# Patient Record
Sex: Female | Born: 1985 | Race: White | Hispanic: No | Marital: Married | Smoking: Never smoker
Health system: Southern US, Community
[De-identification: ages and names within clinical notes are randomized; demographics above are authoritative.]

## PROBLEM LIST (undated history)

## (undated) ENCOUNTER — Inpatient Hospital Stay (HOSPITAL_COMMUNITY): Payer: Self-pay

## (undated) DIAGNOSIS — Z789 Other specified health status: Secondary | ICD-10-CM

## (undated) HISTORY — PX: WISDOM TOOTH EXTRACTION: SHX21

## (undated) HISTORY — PX: TONSILLECTOMY AND ADENOIDECTOMY: SHX28

## (undated) HISTORY — PX: DILATION AND CURETTAGE OF UTERUS: SHX78

## (undated) HISTORY — PX: LAPAROSCOPY: SHX197

---
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
## 2010-09-13 ENCOUNTER — Encounter: Payer: Self-pay | Admitting: Family Medicine

## 2010-10-07 ENCOUNTER — Ambulatory Visit (HOSPITAL_COMMUNITY)
Admission: RE | Admit: 2010-10-07 | Discharge: 2010-10-07 | Disposition: A | Discharge status: Home / Self Care | Payer: Managed Care, Other (non HMO) | Source: Ambulatory Visit | Attending: Obstetrics and Gynecology | Admitting: Obstetrics and Gynecology

## 2010-10-07 DIAGNOSIS — N949 Unspecified condition associated with female genital organs and menstrual cycle: Secondary | ICD-10-CM | POA: Insufficient documentation

## 2010-10-07 LAB — APTT: aPTT: 30 seconds (ref 24–37)

## 2010-10-07 LAB — URINALYSIS, ROUTINE W REFLEX MICROSCOPIC
Bilirubin Urine: NEGATIVE
Hgb urine dipstick: NEGATIVE
Ketones, ur: NEGATIVE mg/dL
Nitrite: NEGATIVE
Protein, ur: NEGATIVE mg/dL
Urine Glucose, Fasting: NEGATIVE mg/dL
Urobilinogen, UA: 0.2 mg/dL (ref 0.0–1.0)
pH: 6.5 (ref 5.0–8.0)

## 2010-10-07 LAB — SURGICAL PCR SCREEN: Staphylococcus aureus: NEGATIVE

## 2010-10-07 LAB — CBC
HCT: 42.5 % (ref 36.0–46.0)
MCH: 26.4 pg (ref 26.0–34.0)
MCHC: 32 g/dL (ref 30.0–36.0)
MCV: 82.5 fL (ref 78.0–100.0)
Platelets: 210 10*3/uL (ref 150–400)
RBC: 5.15 MIL/uL — ABNORMAL HIGH (ref 3.87–5.11)
RDW: 14.2 % (ref 11.5–15.5)
WBC: 8.2 10*3/uL (ref 4.0–10.5)

## 2010-10-07 LAB — PROTIME-INR
INR: 1.01 (ref 0.00–1.49)
Prothrombin Time: 13.5 seconds (ref 11.6–15.2)

## 2010-10-07 LAB — BASIC METABOLIC PANEL
BUN: 10 mg/dL (ref 6–23)
Creatinine, Ser: 0.94 mg/dL (ref 0.4–1.2)
GFR calc non Af Amer: >60 mL/min (ref >60)

## 2010-10-07 LAB — PREGNANCY, URINE: Preg Test, Ur: NEGATIVE

## 2010-10-20 NOTE — Op Note (Addendum)
[name], Kelley NO.:  [id number]  MEDICAL RECORD NO.:  [id number]           PATIENT TYPE:  O  LOCATION:  [facility]                          F[facility]  [facility]  PHYSICIAN:  Miguel Aschoff, M.D.       DATE OF BIRTH:  Mar 14, 1986  DATE OF PROCEDURE:  10/07/2010 DATE OF DISCHARGE:  10/07/2010                              OPERATIVE REPORT   PREOPERATIVE DIAGNOSIS:  Chronic pelvic pain.  POSTOPERATIVE DIAGNOSIS:  Chronic pelvic pain.  PROCEDURE:  Diagnostic laparoscopy.  SURGEON:  Miguel Aschoff, M.D.  ANESTHESIA:  General.  COMPLICATIONS:  None.  JUSTIFICATION:  The patient is a 25 year old white female gravida 3, para 2-0-1-2 with history of persistent pelvic pain which has been going on for years associated with lots of pain with sex.  Because of her symptomatology, she has requested that a definitive procedure be carried out in an effort to control her pain and see if the etiology can be established.  She presents now to undergo diagnostic laparoscopy. Informed consent has been obtained.  PROCEDURE:  The patient was taken to the operating placed in supine position.  General anesthesia was administered without difficulty.  She was then placed in the dorsal lithotomy position, prepped and draped in the usual sterile fashion.  Once this was done, the bladder was catheterized.  Examination under anesthesia was then carried out.  This revealed normal external genitalia Normal Bartholin, Skene glands, normal urethra.  The vaginal vault was without gross lesion.  The cervix was without gross lesion.  The adnexa revealed no masses.  At this point, speculum was placed in the vaginal vault.  Anterior cervical lip was grasped with a tenaculum and then a Hulka tenaculum was placed through the cervix and held.  Attention was then directed to the umbilicus where a small infraumbilical incision was made.  A Veress needle was then inserted and the abdomen was insufflated with 3  liters of CO2.  Following the insufflation, the trocar to the laparoscope was placed followed by laparoscope itself.  Then under direct visualization, a secondary 5-mm port was established suprapubically.  Systematic inspection of the pelvic organs and abdominal organs revealed the bladder peritoneum to be unremarkable.  The round ligaments were unremarkable.  No hernias were noted.  The uterus was small somewhat mottled in appearance and suggestive of possible adenomyosis but no lesions were noted on the surface of the uterus.  No myomas were noted. The tubes were examined along the course that were normal.  The fimbriae were fine and delicate.  The ovaries appeared to be within normal limits.  The only remarkable finding in the pelvis was some increased varicosities noted in the left adnexa.  The cul-de-sac was inspected. There were no peritoneal window seen.  There was no evidence of endometrial implants.  The uterosacral ligaments appeared to be within normal limits.  The appendix was then visualized, was noted be also within normal limits.  The intestinal surfaces were unremarkable.  Liver was visualized again appeared to be within normal limits.  With no other abnormalities being noted in the pelvis, it was elected to complete the procedure  after photographic documentation of the findings, the CO2 was allowed to escape.  All instruments were removed and small incisions were closed with subcuticular 4-0 Vicryl.  Dermabond was then applied. The estimated blood loss was minimal.  Plan is for the patient to be discharged home.  Medications for home include Tylox one every 3 hours as needed for pain. She has been instructed to place nothing in vagina for 2 weeks.  She is to call within 2 days to discuss her intraoperative findings, and she is to set a followup visit up for 2 weeks.  The patient is call to [place] if there are any problems such as fever, pain, or heavy bleeding.  She  was sent home in satisfactory condition.     Miguel Aschoff, M.D.     AR/MEDQ  D:  10/07/2010  T:  10/08/2010  Job:  [id number]  Electronically Signed by Miguel Aschoff M.D. on 10/20/2010 02:09:05 PM

## 2011-03-05 ENCOUNTER — Other Ambulatory Visit: Payer: Self-pay | Admitting: Obstetrics and Gynecology

## 2011-03-05 DIAGNOSIS — R102 Pelvic and perineal pain: Secondary | ICD-10-CM

## 2011-03-06 ENCOUNTER — Other Ambulatory Visit: Payer: Managed Care, Other (non HMO)

## 2011-03-12 ENCOUNTER — Other Ambulatory Visit: Payer: Managed Care, Other (non HMO)

## 2011-03-12 ENCOUNTER — Ambulatory Visit
Admission: RE | Admit: 2011-03-12 | Discharge: 2011-03-12 | Disposition: A | Discharge status: Home / Self Care | Payer: Managed Care, Other (non HMO) | Source: Ambulatory Visit | Attending: Obstetrics and Gynecology | Admitting: Obstetrics and Gynecology

## 2011-03-12 DIAGNOSIS — R102 Pelvic and perineal pain: Secondary | ICD-10-CM

## 2011-03-12 MED ORDER — IOHEXOL 300 MG/ML  SOLN
125.0000 mL | Freq: Once | INTRAMUSCULAR | Status: AC | PRN
  Administered 2011-03-12: 125 mL via INTRAVENOUS

## 2011-04-13 ENCOUNTER — Other Ambulatory Visit (HOSPITAL_COMMUNITY): Payer: Self-pay | Admitting: Urology

## 2011-04-13 DIAGNOSIS — R102 Pelvic and perineal pain: Secondary | ICD-10-CM

## 2011-04-23 ENCOUNTER — Encounter (HOSPITAL_COMMUNITY)
Admission: RE | Admit: 2011-04-23 | Discharge: 2011-04-23 | Disposition: A | Discharge status: Home / Self Care | Payer: Managed Care, Other (non HMO) | Source: Ambulatory Visit | Attending: Urology | Admitting: Urology

## 2011-04-23 DIAGNOSIS — R102 Pelvic and perineal pain: Secondary | ICD-10-CM

## 2011-04-23 DIAGNOSIS — N949 Unspecified condition associated with female genital organs and menstrual cycle: Secondary | ICD-10-CM | POA: Insufficient documentation

## 2011-04-23 MED ORDER — FUROSEMIDE 10 MG/ML IJ SOLN: 42.0000 mg/h | Freq: Once | INTRAVENOUS | Status: DC

## 2011-04-23 MED ORDER — TECHNETIUM TC 99M MERTIATIDE
15.0000 | Freq: Once | INTRAVENOUS | Status: AC | PRN
  Administered 2011-04-23: 15 via INTRAVENOUS

## 2013-05-21 IMAGING — NM NM RENAL IMAGING FLOW W/ PHARM
2 series · 12 of 12 positions shown · non-contrast
Comparison: CT abdomen 03/12/2011.

CLINICAL DATA: Pelvic pain.  Evaluate obstruction.

NUCLEAR MEDICINE RENAL SCINTIANGIOGRAPHY WITH FLOW AND FUNCTION AND
PHARMACOLOGIC AUGMENTATION
TECHNIQUE: Radionuclide angiographic and sequential renal images
were obtained after intravenous injection of radiopharmaceutical.
Imaging was continued during slow intravenous injection of Lasix
approximately 20-30 minutes after the start of the examination.
Radiopharmaceutical: 15.0 mCi technetium MAG 3

[Series 1: re renal qualitative · 9.51mm/px · 6 of 107 frames shown (1 of 2)]
[frame 9/107]
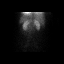
[frame 27/107]
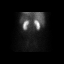
[frame 45/107]
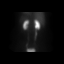
[frame 63/107]
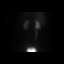
[frame 81/107]
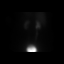
[frame 99/107]
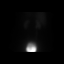

[Series 1: re renal qualitative · 9.51mm/px · 6 of 107 frames shown (2 of 2)]
[frame 9/107]
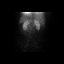
[frame 27/107]
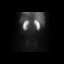
[frame 45/107]
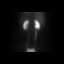
[frame 63/107]
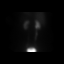
[frame 81/107]
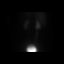
[frame 99/107]
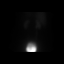

[12 of 12 positions shown; findings below may reference images not displayed]

FINDINGS: The initial flow study demonstrates the appearance of
both kidneys at the same time the aorta is visualized.  There is
symmetric uptake of the radiopharmaceutical in both kidneys.  Very
slight delayed washout of the right kidney compared to the left.
The right ureter is also slightly prominent as demonstrated on the
prior CT scan.  The overall renogram curve is relatively normal.

Relative functioning renal mass is 47.8% on the left and 52.2% on
the right.
IMPRESSION: 1.  Symmetric flow to both kidneys.
2.  Very slight delayed washout of the right kidney compared to the
left.
3.  Relative functioning renal mass is 47.8% on the left and 52.2%
on the right.

## 2013-08-24 ENCOUNTER — Inpatient Hospital Stay (HOSPITAL_COMMUNITY)
Admission: AD | Admit: 2013-08-24 | Discharge: 2013-08-24 | Disposition: A | Discharge status: Home / Self Care | Payer: BC Managed Care – PPO | Source: Ambulatory Visit | Attending: Obstetrics and Gynecology | Admitting: Obstetrics and Gynecology

## 2013-08-24 ENCOUNTER — Inpatient Hospital Stay (HOSPITAL_COMMUNITY): Payer: BC Managed Care – PPO

## 2013-08-24 ENCOUNTER — Encounter (HOSPITAL_COMMUNITY): Payer: Self-pay

## 2013-08-24 DIAGNOSIS — O26859 Spotting complicating pregnancy, unspecified trimester: Secondary | ICD-10-CM | POA: Insufficient documentation

## 2013-08-24 DIAGNOSIS — O26851 Spotting complicating pregnancy, first trimester: Secondary | ICD-10-CM

## 2013-08-24 HISTORY — DX: Other specified health status: Z78.9

## 2013-08-24 LAB — URINALYSIS, ROUTINE W REFLEX MICROSCOPIC
Bilirubin Urine: NEGATIVE
Glucose, UA: NEGATIVE mg/dL
KETONES UR: NEGATIVE mg/dL
LEUKOCYTES UA: NEGATIVE
NITRITE: NEGATIVE
PH: 6.5 (ref 5.0–8.0)
Protein, ur: NEGATIVE mg/dL
Specific Gravity, Urine: 1.01 (ref 1.005–1.030)
Urobilinogen, UA: 0.2 mg/dL (ref 0.0–1.0)

## 2013-08-24 LAB — CBC
HCT: 37.3 % (ref 36.0–46.0)
HEMOGLOBIN: 12.7 g/dL (ref 12.0–15.0)
MCH: 27.2 pg (ref 26.0–34.0)
MCHC: 34 g/dL (ref 30.0–36.0)
MCV: 79.9 fL (ref 78.0–100.0)
PLATELETS: 196 10*3/uL (ref 150–400)
RBC: 4.67 MIL/uL (ref 3.87–5.11)
RDW: 13.3 % (ref 11.5–15.5)
WBC: 10.6 10*3/uL — AB (ref 4.0–10.5)

## 2013-08-24 LAB — HCG, QUANTITATIVE, PREGNANCY: HCG, BETA CHAIN, QUANT, S: 27514 m[IU]/mL — AB (ref <5)

## 2013-08-24 LAB — POCT PREGNANCY, URINE: PREG TEST UR: POSITIVE — AB

## 2013-08-24 LAB — URINE MICROSCOPIC-ADD ON

## 2013-08-24 MED ORDER — RHO D IMMUNE GLOBULIN 1500 UNIT/2ML IJ SOLN
300.0000 ug | Freq: Once | INTRAMUSCULAR | Status: AC
  Administered 2013-08-24: 300 ug via INTRAMUSCULAR
  Filled 2013-08-24: qty 2

## 2013-08-24 NOTE — MAU Provider Note (Signed)
History     CSN: [id number]  Arrival date and time: 08/24/13 1908   None     Chief Complaint  Patient presents with  . Vaginal Bleeding   HPI  Diana Kelley is a 28 y.o. [id number] at [id number] who presents today with spotting. She reports that she is O negative blood type. She started spotting around 1800 today she denies any pain or recent intercourse. She states that the bleeding is like a light period. She is not bleeding any longer.   Past Medical History  Diagnosis Date  . Medical history non-contributory     Past Surgical History  Procedure Laterality Date  . Dilation and curettage of uterus    . Laparoscopy      History reviewed. No pertinent family history.  History  Substance Use Topics  . Smoking status: Never Smoker   . Smokeless tobacco: Not on file  . Alcohol Use: No    Allergies:  Allergies  Allergen Reactions  . Naproxen Nausea And Vomiting    Prescriptions prior to admission  Medication Sig Dispense Refill  . Prenatal Vit-Fe Fumarate-FA (PRENATAL MULTIVITAMIN) TABS tablet Take 1 tablet by mouth daily at 12 noon.      . ondansetron (ZOFRAN) 4 MG tablet Take 4 tablets by mouth.      . progesterone (PROMETRIUM) 200 MG capsule Take 1 capsule by mouth daily.      . promethazine (PHENERGAN) 25 MG tablet Take 25 mg by mouth.      . TAMIFLU 75 MG capsule Take 10 mg by mouth daily.        ROS Physical Exam   Blood pressure 119/76, pulse 79, temperature 97.6 F (36.4 C), temperature source Oral, resp. rate 16, height 5\' 3"  (1.6 m), weight 95.255 kg (210 lb), last menstrual period 06/22/2013.  Physical Exam  Nursing note and vitals reviewed. Constitutional: She is oriented to person, place, and time. She appears well-developed and well-nourished. No distress.  Cardiovascular: Normal rate.   Respiratory: Effort normal.  Neurological: She is alert and oriented to person, place, and time.  Skin: Skin is warm and dry.  Psychiatric: She has a normal mood  and affect.    MAU Course  Procedures  Results for orders placed during the hospital encounter of 08/24/13 (from the past 24 hour(s))  URINALYSIS, ROUTINE W REFLEX MICROSCOPIC     Status: Abnormal   Collection Time    08/24/13  7:55 PM      Result Value Range   Color, Urine STRAW (*) YELLOW   APPearance CLEAR  CLEAR   Specific Gravity, Urine 1.010  1.005 - 1.030   pH 6.5  5.0 - 8.0   Glucose, UA NEGATIVE  NEGATIVE mg/dL   Hgb urine dipstick SMALL (*) NEGATIVE   Bilirubin Urine NEGATIVE  NEGATIVE   Ketones, ur NEGATIVE  NEGATIVE mg/dL   Protein, ur NEGATIVE  NEGATIVE mg/dL   Urobilinogen, UA 0.2  0.0 - 1.0 mg/dL   Nitrite NEGATIVE  NEGATIVE   Leukocytes, UA NEGATIVE  NEGATIVE  URINE MICROSCOPIC-ADD ON     Status: None   Collection Time    08/24/13  7:55 PM      Result Value Range   Squamous Epithelial / LPF RARE  RARE   WBC, UA 0-2  <3 WBC/hpf   RBC / HPF 0-2  <3 RBC/hpf   Bacteria, UA RARE  RARE  POCT PREGNANCY, URINE     Status: Abnormal   Collection Time  08/24/13  8:03 PM      Result Value Range   Preg Test, Ur POSITIVE (*) NEGATIVE  CBC     Status: Abnormal   Collection Time    08/24/13  8:10 PM      Result Value Range   WBC 10.6 (*) 4.0 - 10.5 K/uL   RBC 4.67  3.87 - 5.11 MIL/uL   Hemoglobin 12.7  12.0 - 15.0 g/dL   HCT 16.1  09.6 - 04.5 %   MCV 79.9  78.0 - 100.0 fL   MCH 27.2  26.0 - 34.0 pg   MCHC 34.0  30.0 - 36.0 g/dL   RDW 40.9  81.1 - 91.4 %   Platelets 196  150 - 400 K/uL  HCG, QUANTITATIVE, PREGNANCY     Status: Abnormal   Collection Time    08/24/13  8:10 PM      Result Value Range   hCG, Beta Chain, Quant, S [id number] (*) <5 mIU/mL   US Ob Comp Less 14 Wks  08/24/2013   CLINICAL DATA:  Vaginal bleeding. Current assigned gestational age of nine weeks 0 days based on office ultrasound.  EXAM: OBSTETRIC <14 WK ULTRASOUND  TECHNIQUE: Transabdominal ultrasound was performed for evaluation of the gestation as well as the maternal uterus and adnexal  regions.  COMPARISON:  None.  FINDINGS: Intrauterine gestational sac: Visualized/normal in shape.  Yolk sac:  Visualized  Embryo:  Visualized  Cardiac Activity: Visualized  Heart Rate: 176 bpm  CRL:   24  mm   9 w 1 d                  [place] EDC: 03/28/2014  Maternal uterus/adnexae: No mass or free fluid identified. Both ovaries are normal in appearance.  IMPRESSION: Single living IUP measuring 9 weeks 1 day, which is concordant with assigned gestational age.  No significant maternal uterine or adnexal abnormality identified.   Electronically Signed   By: Myles Rosenthal M.D.   On: 08/24/2013 21:00     Assessment and Plan   1. Spotting in pregnancy, first trimester    Rhogam given here in MAU Bleeding precautions Return to MAU as needed   Follow-up Information   Follow up with Levi Aland, MD. (as scheduled )    Specialty:  Obstetrics and Gynecology   Contact information:   [address] [place] [address] [phone]        Tawnya Crook 08/24/2013, 9:05 PM

## 2013-08-24 NOTE — Discharge Instructions (Signed)
Rh0 [D] Immune Globulin injection What is this medicine? RhO [D] IMMUNE GLOBULIN (i MYOON GLOB yoo lin) is used to treat idiopathic thrombocytopenic purpura (ITP). This medicine is used in RhO negative mothers who are pregnant with a RhO positive child. It is also used after a transfusion of RhO positive blood into a RhO negative person. This medicine may be used for other purposes; ask your health care provider or pharmacist if you have questions. COMMON BRAND NAME(S): BayRho-D, HyperRHO S/D, MICRhoGAM, RhoGAM, Rhophylac, WinRho SDF What should I tell my health care provider before I take this medicine? They need to know if you have any of these conditions: -bleeding disorders -low levels of immunoglobulin A in the body -no spleen -an unusual or allergic reaction to human immune globulin, other medicines, foods, dyes, or preservatives -pregnant or trying to get pregnant -breast-feeding How should I use this medicine? This medicine is for injection into a muscle or into a vein. It is given by a health care professional in a hospital or clinic setting. Talk to your pediatrician regarding the use of this medicine in children. This medicine is not approved for use in children. Overdosage: If you think you have taken too much of this medicine contact a poison control center or emergency room at once. NOTE: This medicine is only for you. Do not share this medicine with others. What if I miss a dose? It is important not to miss your dose. Call your doctor or health care professional if you are unable to keep an appointment. What may interact with this medicine? -live virus vaccines, like measles, mumps, or rubella This list may not describe all possible interactions. Give your health care provider a list of all the medicines, herbs, non-prescription drugs, or dietary supplements you use. Also tell them if you smoke, drink alcohol, or use illegal drugs. Some items may interact with your  medicine. What should I watch for while using this medicine? This medicine is made from human blood. It may be possible to pass an infection in this medicine. Talk to your doctor about the risks and benefits of this medicine. This medicine may interfere with live virus vaccines. Before you get live virus vaccines tell your health care professional if you have received this medicine within the past 3 months. What side effects may I notice from receiving this medicine? Side effects that you should report to your doctor or health care professional as soon as possible: -allergic reactions like skin rash, itching or hives, swelling of the face, lips, or tongue -breathing problems -chest pain or tightness -yellowing of the eyes or skin Side effects that usually do not require medical attention (report to your doctor or health care professional if they continue or are bothersome): -fever -pain and tenderness at site where injected This list may not describe all possible side effects. Call your doctor for medical advice about side effects. You may report side effects to FDA at 1-800-FDA-1088. Where should I keep my medicine? This drug is given in a hospital or clinic and will not be stored at home. NOTE: This sheet is a summary. It may not cover all possible information. If you have questions about this medicine, talk to your doctor, pharmacist, or health care provider.  2014, Elsevier/Gold Standard. (2008-04-09 14:06:10)  Pregnancy - First Trimester During sexual intercourse, millions of sperm go into the vagina. Only 1 sperm will penetrate and fertilize the female egg while it is in the Fallopian tube. One week later, the fertilized  egg implants into the wall of the uterus. An embryo begins to develop into a baby. At 6 to 8 weeks, the eyes and face are formed and the heartbeat can be seen on ultrasound. At the end of 12 weeks (first trimester), all the baby's organs are formed. Now that you are  pregnant, you will want to do everything you can to have a healthy baby. Two of the most important things are to get good prenatal care and follow your caregiver's instructions. Prenatal care is all the medical care you receive before the baby's birth. It is given to prevent, find, and treat problems during the pregnancy and childbirth. PRENATAL EXAMS  During prenatal visits, your weight, blood pressure, and urine are checked. This is done to make sure you are healthy and progressing normally during the pregnancy.  A pregnant woman should gain 25 to 35 pounds during the pregnancy. However, if you are overweight or underweight, your caregiver will advise you regarding your weight.  Your caregiver will ask and answer questions for you.  Blood work, cervical cultures, other necessary tests, and a Pap test are done during your prenatal exams. These tests are done to check on your health and the probable health of your baby. Tests are strongly recommended and done for HIV with your permission. This is the virus that causes AIDS. These tests are done because medicines can be given to help prevent your baby from being born with this infection should you have been infected without knowing it. Blood work is also used to find out your blood type, previous infections, and follow your blood levels (hemoglobin).  Low hemoglobin (anemia) is common during pregnancy. Iron and vitamins are given to help prevent this. Later in the pregnancy, blood tests for diabetes will be done along with any other tests if any problems develop.  You may need other tests to make sure you and the baby are doing well. CHANGES DURING THE FIRST TRIMESTER  Your body goes through many changes during pregnancy. They vary from person to person. Talk to your caregiver about changes you notice and are concerned about. Changes can include:  Your menstrual period stops.  The egg and sperm carry the genes that determine what you look like.  Genes from you and your partner are forming a baby. The female genes determine whether the baby is a boy or a girl.  Your body increases in girth and you may feel bloated.  Feeling sick to your stomach (nauseous) and throwing up (vomiting). If the vomiting is uncontrollable, call your caregiver.  Your breasts will begin to enlarge and become tender.  Your nipples may stick out more and become darker.  The need to urinate more. Painful urination may mean you have a bladder infection.  Tiring easily.  Loss of appetite.  Cravings for certain kinds of food.  At first, you may gain or lose a couple of pounds.  You may have changes in your emotions from day to day (excited to be pregnant or concerned something may go wrong with the pregnancy and baby).  You may have more vivid and strange dreams. HOME CARE INSTRUCTIONS   It is very important to avoid all smoking, alcohol and non-prescribed drugs during your pregnancy. These affect the formation and growth of the baby. Avoid chemicals while pregnant to ensure the delivery of a healthy infant.  Start your prenatal visits by the 12th week of pregnancy. They are usually scheduled monthly at first, then more often in the  last 2 months before delivery. Keep your caregiver's appointments. Follow your caregiver's instructions regarding medicine use, blood and lab tests, exercise, and diet.  During pregnancy, you are providing food for you and your baby. Eat regular, well-balanced meals. Choose foods such as meat, fish, milk and other low fat dairy products, vegetables, fruits, and whole-grain breads and cereals. Your caregiver will tell you of the ideal weight gain.  You can help morning sickness by keeping soda crackers at the bedside. Eat a couple before arising in the morning. You may want to use the crackers without salt on them.  Eating 4 to 5 small meals rather than 3 large meals a day also may help the nausea and vomiting.  Drinking liquids  between meals instead of during meals also seems to help nausea and vomiting.  A physical sexual relationship may be continued throughout pregnancy if there are no other problems. Problems may be early (premature) leaking of amniotic fluid from the membranes, vaginal bleeding, or belly (abdominal) pain.  Exercise regularly if there are no restrictions. Check with your caregiver or physical therapist if you are unsure of the safety of some of your exercises. Greater weight gain will occur in the last 2 trimesters of pregnancy. Exercising will help:  Control your weight.  Keep you in shape.  Prepare you for labor and delivery.  Help you lose your pregnancy weight after you deliver your baby.  Wear a good support or jogging bra for breast tenderness during pregnancy. This may help if worn during sleep too.  Ask when prenatal classes are available. Begin classes when they are offered.  Do not use hot tubs, steam rooms, or saunas.  Wear your seat belt when driving. This protects you and your baby if you are in an accident.  Avoid raw meat, uncooked cheese, cat litter boxes, and soil used by cats throughout the pregnancy. These carry germs that can cause birth defects in the baby.  The first trimester is a good time to visit your dentist for your dental health. Getting your teeth cleaned is okay. Use a softer toothbrush and brush gently during pregnancy.  Ask for help if you have financial, counseling, or nutritional needs during pregnancy. Your caregiver will be able to offer counseling for these needs as well as refer you for other special needs.  Do not take any medicines or herbs unless told by your caregiver.  Inform your caregiver if there is any mental or physical domestic violence.  Make a list of emergency phone numbers of family, friends, hospital, and police and fire departments.  Write down your questions. Take them to your prenatal visit.  Do not douche.  Do not cross  your legs.  If you have to stand for long periods of time, rotate you feet or take small steps in a circle.  You may have more vaginal secretions that may require a sanitary pad. Do not use tampons or scented sanitary pads. MEDICINES AND DRUG USE IN PREGNANCY  Take prenatal vitamins as directed. The vitamin should contain 1 milligram of folic acid. Keep all vitamins out of reach of children. Only a couple vitamins or tablets containing iron may be fatal to a baby or young child when ingested.  Avoid use of all medicines, including herbs, over-the-counter medicines, not prescribed or suggested by your caregiver. Only take over-the-counter or prescription medicines for pain, discomfort, or fever as directed by your caregiver. Do not use aspirin, ibuprofen, or naproxen unless directed by your caregiver.  Let your caregiver also know about herbs you may be using.  Alcohol is related to a number of birth defects. This includes fetal alcohol syndrome. All alcohol, in any form, should be avoided completely. Smoking will cause low birth rate and premature babies.  Street or illegal drugs are very harmful to the baby. They are absolutely forbidden. A baby born to an addicted mother will be addicted at birth. The baby will go through the same withdrawal an adult does.  Let your caregiver know about any medicines that you have to take and for what reason you take them. SEEK MEDICAL CARE IF:  You have any concerns or worries during your pregnancy. It is better to call with your questions if you feel they cannot wait, rather than worry about them. SEEK IMMEDIATE MEDICAL CARE IF:   An unexplained oral temperature above 102 F (38.9 C) develops, or as your caregiver suggests.  You have leaking of fluid from the vagina (birth canal). If leaking membranes are suspected, take your temperature and inform your caregiver of this when you call.  There is vaginal spotting or bleeding. Notify your caregiver of  the amount and how many pads are used.  You develop a bad smelling vaginal discharge with a change in the color.  You continue to feel sick to your stomach (nauseated) and have no relief from remedies suggested. You vomit blood or coffee ground-like materials.  You lose more than 2 pounds of weight in 1 week.  You gain more than 2 pounds of weight in 1 week and you notice swelling of your face, hands, feet, or legs.  You gain 5 pounds or more in 1 week (even if you do not have swelling of your hands, face, legs, or feet).  You get exposed to [place] measles and have never had them.  You are exposed to fifth disease or chickenpox.  You develop belly (abdominal) pain. Round ligament discomfort is a common non-cancerous (benign) cause of abdominal pain in pregnancy. Your caregiver still must evaluate this.  You develop headache, fever, diarrhea, pain with urination, or shortness of breath.  You fall or are in a car accident or have any kind of trauma.  There is mental or physical violence in your home. Document Released: 08/04/2001 Document Revised: 05/04/2012 Document Reviewed: 02/05/2009 [facility] Patient Information 2014 [place], [place].

## 2013-08-24 NOTE — MAU Note (Signed)
Pt G4 P2 at 9wks with spotting today.  Blood type O neg.

## 2013-08-24 NOTE — L&D Delivery Note (Signed)
Pt was admitted in labor.She had SROM with clear fluid. She had occ variable decels in the first stage. She rapidly completed the first stage. She pushed briefly and had a SVD of one live viable white female infant in the ROA postion over an intact perineum. Shoulder cord x 1. Placenta S/I. EBL-400cc/ Baby to NBN.

## 2013-08-25 LAB — RH IG WORKUP (INCLUDES ABO/RH)
ABO/RH(D): O NEG
Antibody Screen: NEGATIVE
Gestational Age(Wks): 9
UNIT DIVISION: 0

## 2013-08-25 LAB — ABO/RH: ABO/RH(D): O NEG

## 2013-09-13 LAB — OB RESULTS CONSOLE RUBELLA ANTIBODY, IGM: Rubella: IMMUNE

## 2013-09-13 LAB — OB RESULTS CONSOLE RPR: RPR: NONREACTIVE

## 2013-09-13 LAB — OB RESULTS CONSOLE HEPATITIS B SURFACE ANTIGEN: Hepatitis B Surface Ag: NEGATIVE

## 2013-09-13 LAB — OB RESULTS CONSOLE HIV ANTIBODY (ROUTINE TESTING): HIV: NONREACTIVE

## 2014-03-02 LAB — OB RESULTS CONSOLE GBS: STREP GROUP B AG: NEGATIVE

## 2014-03-19 ENCOUNTER — Encounter (HOSPITAL_COMMUNITY): Payer: BC Managed Care – PPO | Admitting: Anesthesiology

## 2014-03-19 ENCOUNTER — Inpatient Hospital Stay (HOSPITAL_COMMUNITY)
Admission: AD | Admit: 2014-03-19 | Discharge: 2014-03-21 | DRG: 775 | Disposition: A | Discharge status: Home / Self Care | Payer: BC Managed Care – PPO | Source: Ambulatory Visit | Attending: Obstetrics and Gynecology | Admitting: Obstetrics and Gynecology

## 2014-03-19 ENCOUNTER — Inpatient Hospital Stay (HOSPITAL_COMMUNITY): Payer: BC Managed Care – PPO | Admitting: Anesthesiology

## 2014-03-19 ENCOUNTER — Encounter (HOSPITAL_COMMUNITY): Payer: Self-pay

## 2014-03-19 DIAGNOSIS — O479 False labor, unspecified: Secondary | ICD-10-CM | POA: Diagnosis present

## 2014-03-19 DIAGNOSIS — Z3483 Encounter for supervision of other normal pregnancy, third trimester: Secondary | ICD-10-CM

## 2014-03-19 DIAGNOSIS — Z3480 Encounter for supervision of other normal pregnancy, unspecified trimester: Secondary | ICD-10-CM

## 2014-03-19 LAB — CBC
HEMATOCRIT: 35.6 % — AB (ref 36.0–46.0)
HEMOGLOBIN: 12.2 g/dL (ref 12.0–15.0)
MCH: 28.6 pg (ref 26.0–34.0)
MCHC: 34.3 g/dL (ref 30.0–36.0)
MCV: 83.6 fL (ref 78.0–100.0)
Platelets: 197 10*3/uL (ref 150–400)
RBC: 4.26 MIL/uL (ref 3.87–5.11)
RDW: 14.4 % (ref 11.5–15.5)
WBC: 20.4 10*3/uL — ABNORMAL HIGH (ref 4.0–10.5)

## 2014-03-19 MED ORDER — CITRIC ACID-SODIUM CITRATE 334-500 MG/5ML PO SOLN: 30.0000 mL | ORAL | Status: DC | PRN

## 2014-03-19 MED ORDER — EPHEDRINE 5 MG/ML INJ
10.0000 mg | INTRAVENOUS | Status: DC | PRN
  Filled 2014-03-19: qty 2

## 2014-03-19 MED ORDER — IBUPROFEN 600 MG PO TABS: 600.0000 mg | ORAL_TABLET | Freq: Four times a day (QID) | ORAL | Status: DC | PRN

## 2014-03-19 MED ORDER — LACTATED RINGERS IV SOLN: 500.0000 mL | Freq: Once | INTRAVENOUS | Status: DC

## 2014-03-19 MED ORDER — ACETAMINOPHEN 325 MG PO TABS: 650.0000 mg | ORAL_TABLET | ORAL | Status: DC | PRN

## 2014-03-19 MED ORDER — PHENYLEPHRINE 40 MCG/ML (10ML) SYRINGE FOR IV PUSH (FOR BLOOD PRESSURE SUPPORT)
80.0000 ug | PREFILLED_SYRINGE | INTRAVENOUS | Status: DC | PRN
  Filled 2014-03-19: qty 2

## 2014-03-19 MED ORDER — FENTANYL 2.5 MCG/ML BUPIVACAINE 1/10 % EPIDURAL INFUSION (WH - ANES)
INTRAMUSCULAR | Status: DC | PRN
  Administered 2014-03-19: 14 mL/h via EPIDURAL

## 2014-03-19 MED ORDER — OXYCODONE-ACETAMINOPHEN 5-325 MG PO TABS: 1.0000 | ORAL_TABLET | ORAL | Status: DC | PRN

## 2014-03-19 MED ORDER — LACTATED RINGERS IV SOLN: 500.0000 mL | INTRAVENOUS | Status: DC | PRN

## 2014-03-19 MED ORDER — FLEET ENEMA 7-19 GM/118ML RE ENEM: 1.0000 | ENEMA | RECTAL | Status: DC | PRN

## 2014-03-19 MED ORDER — LIDOCAINE HCL (PF) 1 % IJ SOLN
30.0000 mL | INTRAMUSCULAR | Status: DC | PRN
  Filled 2014-03-19: qty 30

## 2014-03-19 MED ORDER — ONDANSETRON HCL 4 MG/2ML IJ SOLN: 4.0000 mg | Freq: Four times a day (QID) | INTRAMUSCULAR | Status: DC | PRN

## 2014-03-19 MED ORDER — FENTANYL 2.5 MCG/ML BUPIVACAINE 1/10 % EPIDURAL INFUSION (WH - ANES)
14.0000 mL/h | INTRAMUSCULAR | Status: DC | PRN
  Administered 2014-03-19: 14 mL/h via EPIDURAL
  Filled 2014-03-19: qty 125

## 2014-03-19 MED ORDER — DIPHENHYDRAMINE HCL 50 MG/ML IJ SOLN: 12.5000 mg | INTRAMUSCULAR | Status: DC | PRN

## 2014-03-19 MED ORDER — PHENYLEPHRINE 40 MCG/ML (10ML) SYRINGE FOR IV PUSH (FOR BLOOD PRESSURE SUPPORT)
80.0000 ug | PREFILLED_SYRINGE | INTRAVENOUS | Status: DC | PRN
Start: 2014-03-19 — End: 2014-03-20
  Filled 2014-03-19: qty 10
  Filled 2014-03-19: qty 2

## 2014-03-19 MED ORDER — OXYTOCIN 40 UNITS IN LACTATED RINGERS INFUSION - SIMPLE MED
62.5000 mL/h | INTRAVENOUS | Status: DC
  Filled 2014-03-19: qty 1000

## 2014-03-19 MED ORDER — OXYTOCIN BOLUS FROM INFUSION: 500.0000 mL | INTRAVENOUS | Status: DC

## 2014-03-19 MED ORDER — LIDOCAINE HCL (PF) 1 % IJ SOLN
INTRAMUSCULAR | Status: DC | PRN
Start: 2014-03-19 — End: 2014-03-20
  Administered 2014-03-19 (×2): 4 mL

## 2014-03-19 MED ORDER — EPHEDRINE 5 MG/ML INJ
10.0000 mg | INTRAVENOUS | Status: DC | PRN
Start: 2014-03-19 — End: 2014-03-20
  Filled 2014-03-19: qty 2

## 2014-03-19 MED ORDER — LACTATED RINGERS IV SOLN
INTRAVENOUS | Status: DC
  Administered 2014-03-19 – 2014-03-20 (×2): via INTRAVENOUS

## 2014-03-19 NOTE — Anesthesia Procedure Notes (Signed)
Epidural Patient location during procedure: OB Start time: 03/19/2014 11:14 PM  Staffing Anesthesiologist: Ezechiel Stooksbury A. Performed by: anesthesiologist   Preanesthetic Checklist Completed: patient identified, site marked, surgical consent, pre-op evaluation, timeout performed, IV checked, risks and benefits discussed and monitors and equipment checked  Epidural Patient position: sitting Prep: site prepped and draped and DuraPrep Patient monitoring: continuous pulse ox and blood pressure Approach: midline Location: L3-L4 Injection technique: LOR air  Needle:  Needle type: Tuohy  Needle gauge: 17 G Needle length: 9 cm and 9 Needle insertion depth: 5 cm cm Catheter type: closed end flexible Catheter size: 19 Gauge Catheter at skin depth: 10 cm Test dose: negative and Other  Assessment Events: blood not aspirated, injection not painful, no injection resistance, negative IV test and no paresthesia  Additional Notes Patient identified. Risks and benefits discussed including failed block, incomplete  Pain control, post dural puncture headache, nerve damage, paralysis, blood pressure Changes, nausea, vomiting, reactions to medications-both toxic and allergic and post Partum back pain. All questions were answered. Patient expressed understanding and wished to proceed. Sterile technique was used throughout procedure. Epidural site was Dressed with sterile barrier dressing. No paresthesias, signs of intravascular injection Or signs of intrathecal spread were encountered.  Patient was more comfortable after the epidural was dosed. Please see RN's note for documentation of vital signs and FHR which are stable.

## 2014-03-19 NOTE — MAU Note (Signed)
Was 3 cm in office today.  Contractions every 7 min then got quicker.  Denies leaking.  Denies bleeding.  Baby moving well.

## 2014-03-19 NOTE — Anesthesia Preprocedure Evaluation (Signed)
Anesthesia Evaluation  Patient identified by MRN, date of birth, ID band Patient awake    Reviewed: Allergy & Precautions, H&P , Patient's Chart, lab work & pertinent test results  Airway Mallampati: III TM Distance: >3 FB Neck ROM: Full    Dental no notable dental hx. (+) Teeth Intact   Pulmonary neg pulmonary ROS,  breath sounds clear to auscultation  Pulmonary exam normal       Cardiovascular negative cardio ROS  Rhythm:Regular Rate:Normal     Neuro/Psych negative neurological ROS  negative psych ROS   GI/Hepatic negative GI ROS, Neg liver ROS,   Endo/Other  Obesity  Renal/GU negative Renal ROS  negative genitourinary   Musculoskeletal negative musculoskeletal ROS (+)   Abdominal (+) + obese,   Peds  Hematology negative hematology ROS (+)   Anesthesia Other Findings   Reproductive/Obstetrics (+) Pregnancy                           Anesthesia Physical Anesthesia Plan  ASA: II  Anesthesia Plan: Epidural   Post-op Pain Management:    Induction:   Airway Management Planned: Natural Airway  Additional Equipment:   Intra-op Plan:   Post-operative Plan:   Informed Consent: I have reviewed the patients History and Physical, chart, labs and discussed the procedure including the risks, benefits and alternatives for the proposed anesthesia with the patient or authorized representative who has indicated his/her understanding and acceptance.     Plan Discussed with: Anesthesiologist  Anesthesia Plan Comments:         Anesthesia Quick Evaluation  

## 2014-03-20 ENCOUNTER — Encounter (HOSPITAL_COMMUNITY): Payer: Self-pay | Admitting: *Deleted

## 2014-03-20 DIAGNOSIS — Z3480 Encounter for supervision of other normal pregnancy, unspecified trimester: Secondary | ICD-10-CM

## 2014-03-20 DIAGNOSIS — O479 False labor, unspecified: Secondary | ICD-10-CM | POA: Diagnosis not present

## 2014-03-20 LAB — RPR

## 2014-03-20 MED ORDER — DIBUCAINE 1 % RE OINT: 1.0000 "application " | TOPICAL_OINTMENT | RECTAL | Status: DC | PRN

## 2014-03-20 MED ORDER — SIMETHICONE 80 MG PO CHEW: 80.0000 mg | CHEWABLE_TABLET | ORAL | Status: DC | PRN

## 2014-03-20 MED ORDER — TETANUS-DIPHTH-ACELL PERTUSSIS 5-2.5-18.5 LF-MCG/0.5 IM SUSP: 0.5000 mL | Freq: Once | INTRAMUSCULAR | Status: DC

## 2014-03-20 MED ORDER — ZOLPIDEM TARTRATE 5 MG PO TABS: 5.0000 mg | ORAL_TABLET | Freq: Every evening | ORAL | Status: DC | PRN

## 2014-03-20 MED ORDER — OXYCODONE-ACETAMINOPHEN 5-325 MG PO TABS: 1.0000 | ORAL_TABLET | ORAL | Status: DC | PRN

## 2014-03-20 MED ORDER — WITCH HAZEL-GLYCERIN EX PADS: 1.0000 "application " | MEDICATED_PAD | CUTANEOUS | Status: DC | PRN

## 2014-03-20 MED ORDER — ONDANSETRON HCL 4 MG/2ML IJ SOLN: 4.0000 mg | INTRAMUSCULAR | Status: DC | PRN

## 2014-03-20 MED ORDER — RHO D IMMUNE GLOBULIN 1500 UNIT/2ML IJ SOSY
300.0000 ug | PREFILLED_SYRINGE | Freq: Once | INTRAMUSCULAR | Status: AC
  Administered 2014-03-20: 300 ug via INTRAVENOUS
  Filled 2014-03-20: qty 2

## 2014-03-20 MED ORDER — BENZOCAINE-MENTHOL 20-0.5 % EX AERO: 1.0000 "application " | INHALATION_SPRAY | CUTANEOUS | Status: DC | PRN

## 2014-03-20 MED ORDER — SENNOSIDES-DOCUSATE SODIUM 8.6-50 MG PO TABS
2.0000 | ORAL_TABLET | ORAL | Status: DC
  Administered 2014-03-21: 2 via ORAL
  Filled 2014-03-20: qty 2

## 2014-03-20 MED ORDER — IBUPROFEN 600 MG PO TABS
600.0000 mg | ORAL_TABLET | Freq: Four times a day (QID) | ORAL | Status: DC
  Administered 2014-03-20 – 2014-03-21 (×6): 600 mg via ORAL
  Filled 2014-03-20 (×6): qty 1

## 2014-03-20 MED ORDER — ONDANSETRON HCL 4 MG PO TABS: 4.0000 mg | ORAL_TABLET | ORAL | Status: DC | PRN

## 2014-03-20 MED ORDER — MEASLES, MUMPS & RUBELLA VAC ~~LOC~~ INJ
0.5000 mL | INJECTION | Freq: Once | SUBCUTANEOUS | Status: DC
Start: 2014-03-21 — End: 2014-03-20

## 2014-03-20 NOTE — H&P (Signed)
Pt is a 28 y/o white female G4P2012 at term who presents to the ER c/o labor. She was 5 cm on admission. PNC was uncomplicated. VSSAF HEENT-wnl ABD-gravid, palp contractions FHTs-reactive IMP/ IUP at term in labor PLAN/ Admit

## 2014-03-20 NOTE — Anesthesia Postprocedure Evaluation (Signed)
  Anesthesia Post-op Note  Anesthesia Post Note  Patient: Diana Kelley  Procedure(s) Performed: * No procedures listed *  Anesthesia type: Epidural  Patient location: Mother/Baby  Post pain: Pain level controlled  Post assessment: Post-op Vital signs reviewed  Last Vitals:  Filed Vitals:   03/20/14 0640  BP: 118/79  Pulse: 97  Temp: 37.4 C  Resp: 18    Post vital signs: Reviewed  Level of consciousness:alert  Complications: No apparent anesthesia complications

## 2014-03-20 NOTE — Progress Notes (Signed)
Patient is eating, ambulating, voiding.  Pain control is good.  Filed Vitals:   03/20/14 0516 03/20/14 0547 03/20/14 0600 03/20/14 0640  BP: 120/80 121/71 117/76 118/79  Pulse: 98 100 99 97  Temp:   99.7 F (37.6 C) 99.3 F (37.4 C)  TempSrc:   Oral Oral  Resp:   18 18  Height:      Weight:      SpO2:   98% 98%    Fundus firm Perineum without swelling.  Lab Results  Component Value Date   WBC 20.4* 03/19/2014   HGB 12.2 03/19/2014   HCT 35.6* 03/19/2014   MCV 83.6 03/19/2014   PLT 197 03/19/2014    --/--/O NEG (07/28 0545)/RI  A/P Post partum day 0.  Routine care.  Expect d/c routine.  Baby's blood type is pending.  Diana Kelley A

## 2014-03-21 LAB — RH IG WORKUP (INCLUDES ABO/RH)
ABO/RH(D): O NEG
FETAL SCREEN: NEGATIVE
GESTATIONAL AGE(WKS): 38.5
Unit division: 0

## 2014-03-21 MED ORDER — SENNOSIDES-DOCUSATE SODIUM 8.6-50 MG PO TABS: 2.0000 | ORAL_TABLET | ORAL | Status: DC

## 2014-03-21 MED ORDER — IBUPROFEN 600 MG PO TABS: 600.0000 mg | ORAL_TABLET | Freq: Four times a day (QID) | ORAL | Status: DC

## 2014-03-21 NOTE — Progress Notes (Signed)
Post Partum Day 1-2 Subjective: no complaints, up ad lib, voiding, tolerating PO and + flatus  Objective: Blood pressure 102/56, pulse 78, temperature 97.9 F (36.6 C), temperature source Oral, resp. rate 18, height 5' 3.5" (1.613 m), weight 100.245 kg (221 lb), last menstrual period 06/22/2013, SpO2 98.00%, unknown if currently breastfeeding.  Physical Exam:  General: alert, cooperative and no distress Lochia: appropriate Uterine Fundus: firm DVT Evaluation: No evidence of DVT seen on physical exam. Negative Homan's sign. No cords or calf tenderness. No significant calf/ankle edema.   Recent Labs  03/19/14 2230  HGB 12.2  HCT 35.6*    Assessment/Plan: Discharge home, Breastfeeding and Contraception will discuss at post partum visit   LOS: 2 days   Essie HartINN, Xeng Kucher STACIA 03/21/2014, 9:54 AM

## 2014-03-21 NOTE — Lactation Note (Signed)
This note was copied from the chart of Diana Evorn Gongngelina Jaskiewicz. Lactation Consultation Note  Patient Name: Diana Kelley Reason for consult: Initial assessment Mom reports some nipple tenderness with breastfeeding. Mom denies any breakdown. Mom is asking for Lanolin. LC reviewed care for sore nipples, comfort gels given with instructions, some lanolin given per Mom's request but advised not to use with comfort gels. Mom is experienced BF. Some basic teaching reviewed. LC offered to assist with latch due to nipple tenderness, Mom declined. Engorgement care reviewed if needed. Advised of OP services and support group.   Maternal Data Formula Feeding for Exclusion: No  Feeding Feeding Type: Breast Fed Length of feed: 0 min  LATCH Score/Interventions                      Lactation Tools Discussed/Used Tools: Lanolin;Comfort gels   Consult Status Consult Status: Complete    Diana Kelley, Diana Kelley Kelley, 12:46 PM

## 2014-03-21 NOTE — Discharge Summary (Signed)
Obstetric Discharge Summary Reason for Admission: onset of labor Prenatal Procedures: none Intrapartum Procedures: spontaneous vaginal delivery Postpartum Procedures: none Complications-Operative and Postpartum: none Hemoglobin  Date Value Ref Range Status  03/19/2014 12.2  12.0 - 15.0 g/dL Final     HCT  Date Value Ref Range Status  03/19/2014 35.6* 36.0 - 46.0 % Final    Physical Exam:  General: alert, cooperative and no distress Lochia: appropriate Uterine Fundus: firm DVT Evaluation: No evidence of DVT seen on physical exam. Negative Homan's sign. No cords or calf tenderness. No significant calf/ankle edema.  Discharge Diagnoses: Term Pregnancy-delivered  Discharge Information: Date: 03/21/2014 Activity: pelvic rest Diet: routine Medications: PNV, Ibuprofen and Colace Condition: stable Instructions: refer to practice specific booklet Discharge to: home   Newborn Data: Live born female  Birth Weight: 6 lb 8.1 oz (2950 g) APGAR: 9, 9  Home with mother.  Diana Kelley, Diana Kelley 03/21/2014, 9:58 AM

## 2014-03-24 LAB — TYPE AND SCREEN
ABO/RH(D): O NEG
ANTIBODY SCREEN: POSITIVE
DAT, IgG: NEGATIVE
UNIT DIVISION: 0
Unit division: 0

## 2014-06-25 ENCOUNTER — Encounter (HOSPITAL_COMMUNITY): Payer: Self-pay | Admitting: *Deleted

## 2015-09-22 IMAGING — US US OB COMP LESS 14 WK
1 series · 14 of 16 positions shown · non-contrast
Comparison: None.

CLINICAL DATA: Vaginal bleeding. Current assigned gestational age
of nine weeks 0 days based on office ultrasound.

EXAM:
OBSTETRIC <14 WK ULTRASOUND
TECHNIQUE: Transabdominal ultrasound was performed for evaluation of the
gestation as well as the maternal uterus and adnexal regions.

[Series 1: us ob comp less 14 wks · 14 of 16 slices shown]
[im 1/16]
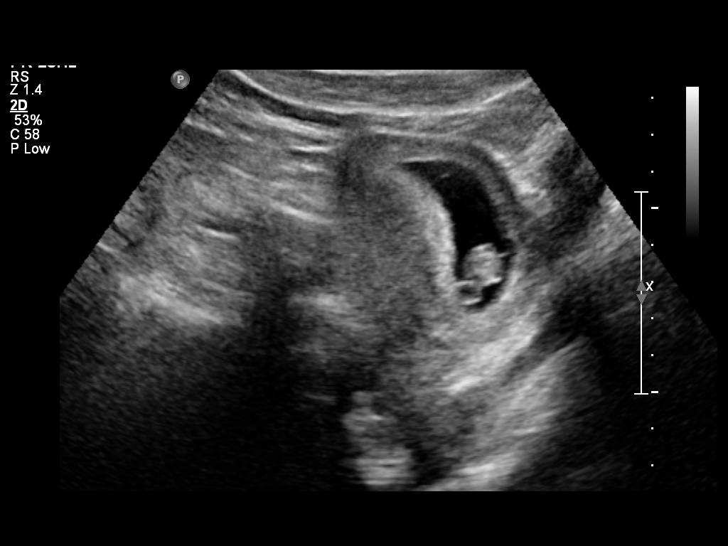
[im 2/16]
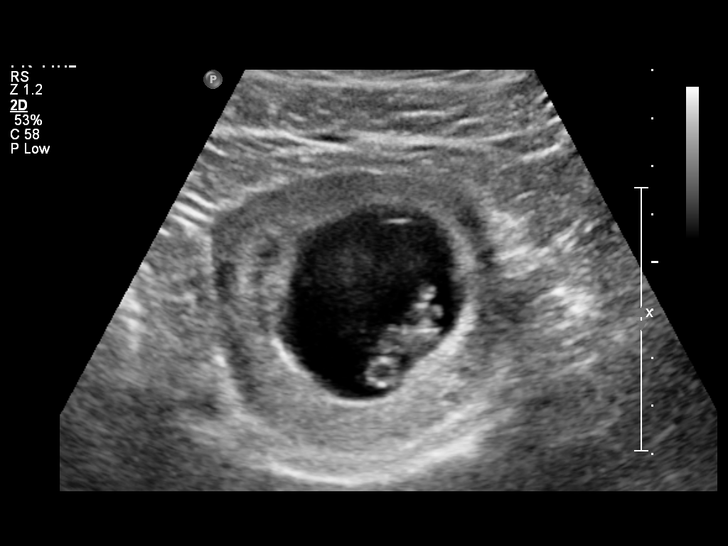
[im 3/16]
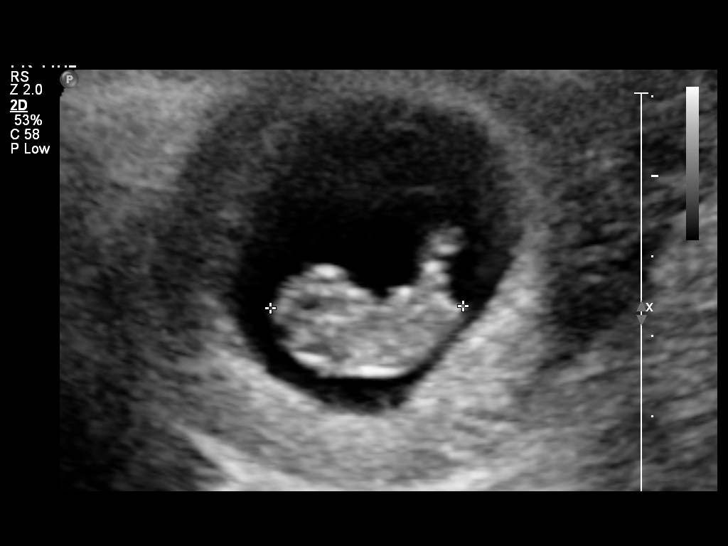
[im 5/16]
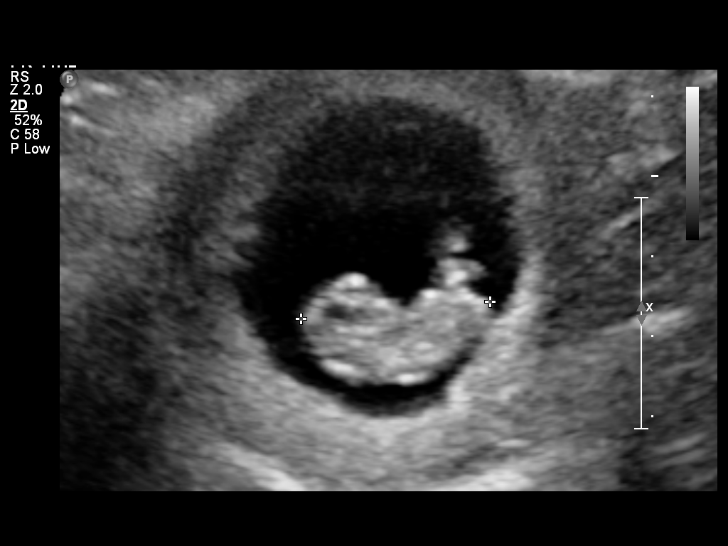
[im 6/16]
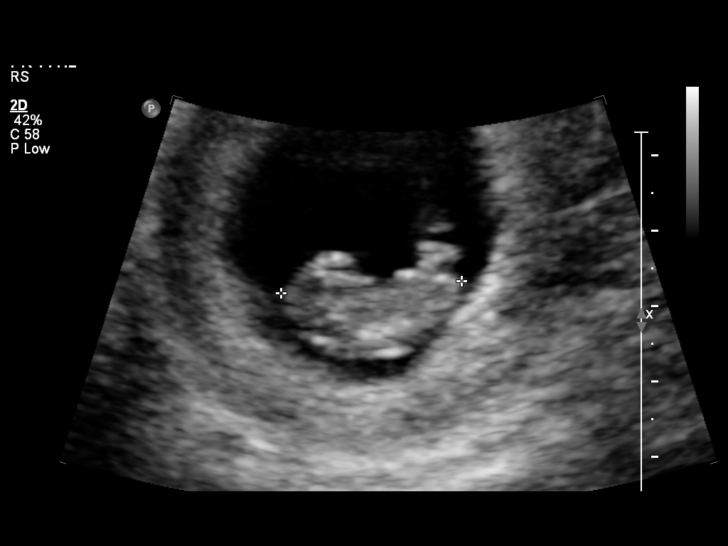
[im 7/16]
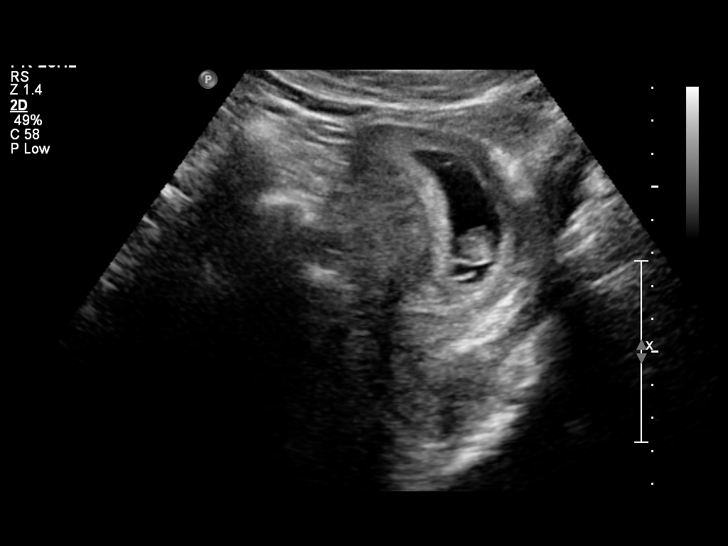
[im 8/16]
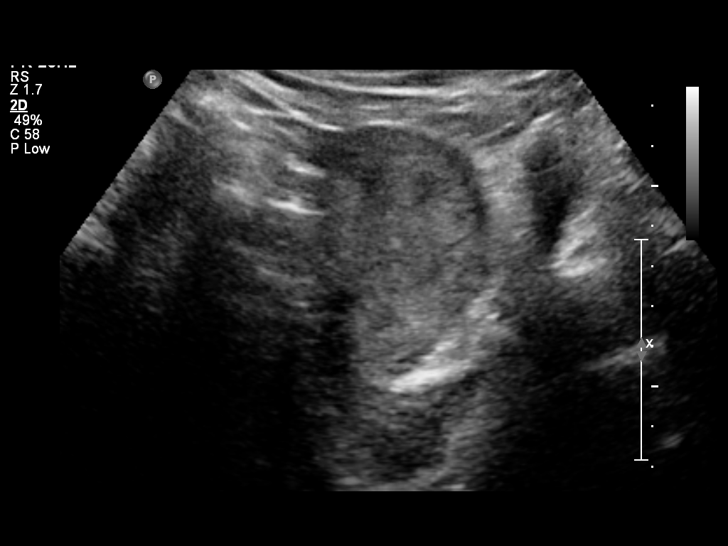
[im 9/16]
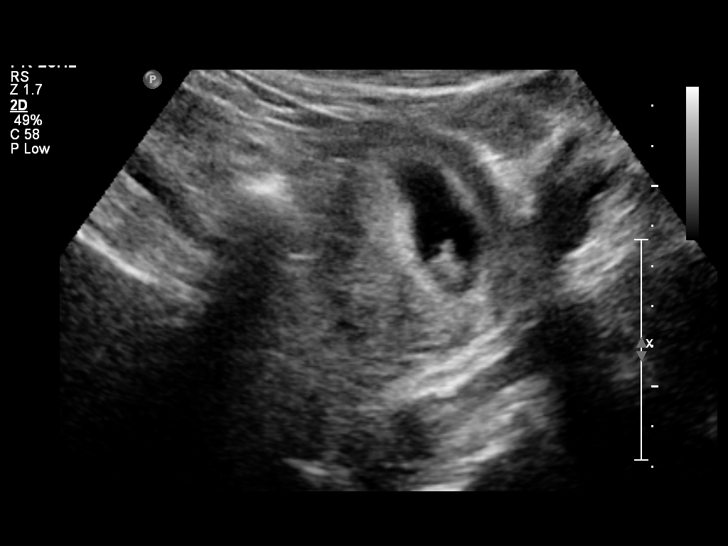
[im 10/16]
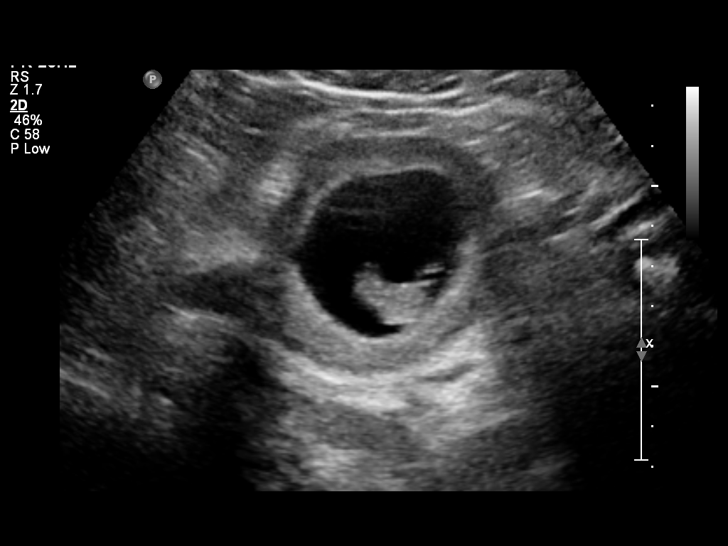
[im 11/16]
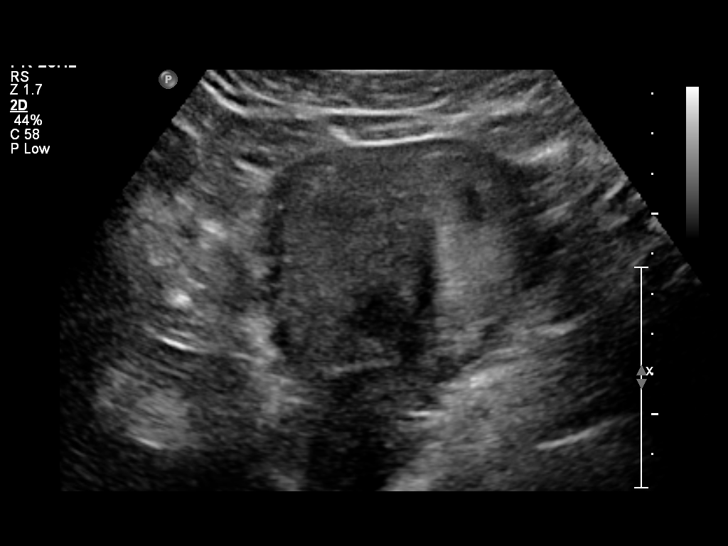
[im 13/16]
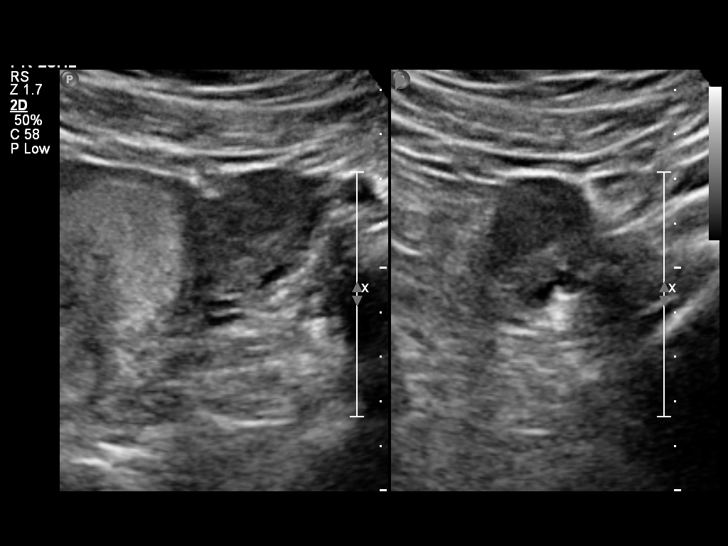
[im 14/16]
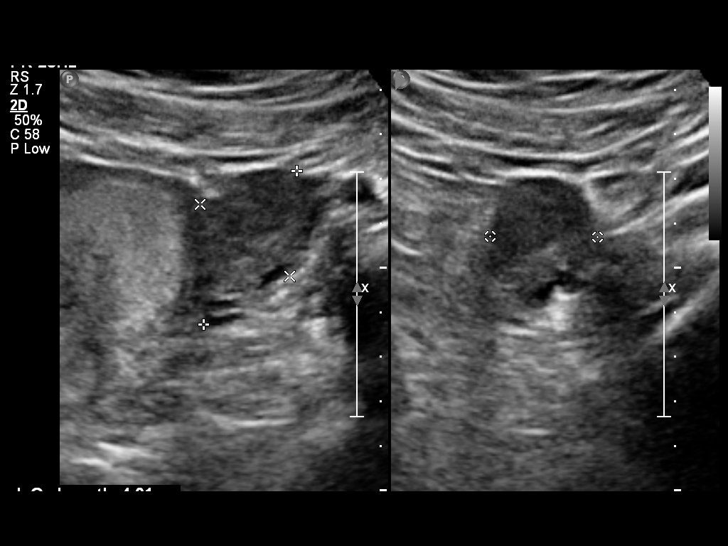
[im 15/16]
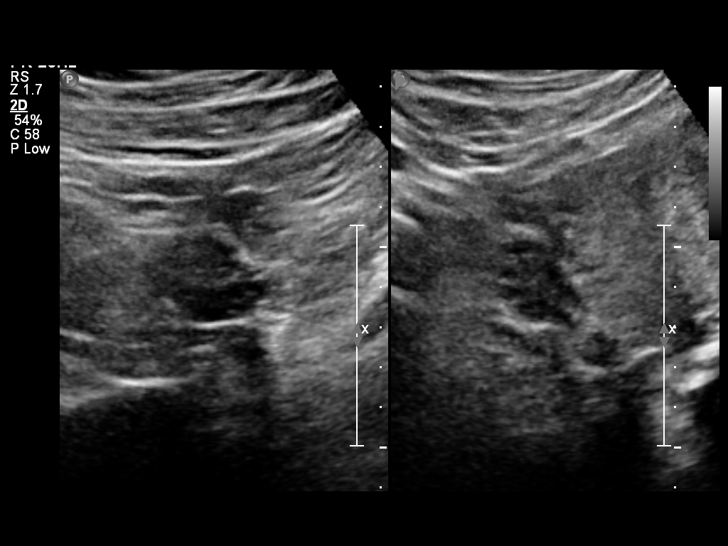
[im 16/16]
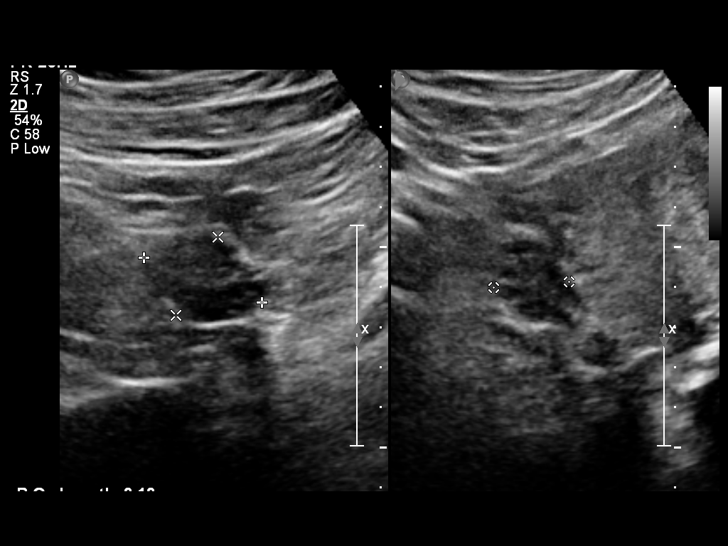

[14 of 16 positions shown; findings below may reference images not displayed]

FINDINGS: Intrauterine gestational sac: Visualized/normal in shape.

Yolk sac:  Visualized

Embryo:  Visualized

Cardiac Activity: Visualized

Heart Rate: 176 bpm

CRL:   24  mm   9 w 1 d                  US EDC: 03/28/2014

Maternal uterus/adnexae: No mass or free fluid identified. Both
ovaries are normal in appearance.
IMPRESSION: Single living IUP measuring 9 weeks 1 day, which is concordant with
assigned gestational age.

No significant maternal uterine or adnexal abnormality identified.

## 2017-03-27 ENCOUNTER — Encounter (HOSPITAL_COMMUNITY): Payer: Self-pay

## 2017-03-27 ENCOUNTER — Inpatient Hospital Stay (HOSPITAL_COMMUNITY)
Admission: AD | Admit: 2017-03-27 | Discharge: 2017-03-27 | Disposition: A | Discharge status: Home / Self Care | Payer: BLUE CROSS/BLUE SHIELD | Source: Ambulatory Visit | Attending: Obstetrics and Gynecology | Admitting: Obstetrics and Gynecology

## 2017-03-27 DIAGNOSIS — N926 Irregular menstruation, unspecified: Secondary | ICD-10-CM | POA: Insufficient documentation

## 2017-03-27 LAB — URINALYSIS, ROUTINE W REFLEX MICROSCOPIC
Bilirubin Urine: NEGATIVE
GLUCOSE, UA: NEGATIVE mg/dL
KETONES UR: NEGATIVE mg/dL
LEUKOCYTES UA: NEGATIVE
Nitrite: NEGATIVE
PROTEIN: NEGATIVE mg/dL
Specific Gravity, Urine: 1.002 — ABNORMAL LOW (ref 1.005–1.030)
pH: 6 (ref 5.0–8.0)

## 2017-03-27 LAB — HCG, QUANTITATIVE, PREGNANCY: hCG, Beta Chain, Quant, S: <1 m[IU]/mL (ref <5)

## 2017-03-27 LAB — POCT PREGNANCY, URINE: PREG TEST UR: NEGATIVE

## 2017-03-27 NOTE — MAU Note (Signed)
Pt last period was 02/05/2017. Usually has regular periods.

## 2017-03-27 NOTE — MAU Provider Note (Signed)
History   [id number] in with c/o vag bleeding. States LMP 02/05/17. States periods are always regular.Pt insistant on blood test to prove to her she is not preg. States not on any contraception, trying for pregnancy. Denies any STD risk.  CSN: [id number]  Arrival date & time 03/27/17  0926   None     Chief Complaint  Patient presents with  . Vaginal Bleeding    HPI  Past Medical History:  Diagnosis Date  . Medical history non-contributory     Past Surgical History:  Procedure Laterality Date  . DILATION AND CURETTAGE OF UTERUS    . LAPAROSCOPY    . TONSILLECTOMY AND ADENOIDECTOMY    . WISDOM TOOTH EXTRACTION      History reviewed. No pertinent family history.  Social History  Substance Use Topics  . Smoking status: Never Smoker  . Smokeless tobacco: Never Used  . Alcohol use No    OB History    Gravida Para Term Preterm AB Living   4 3 3   1 3    SAB TAB Ectopic Multiple Live Births   1       3      Review of Systems  Constitutional: Negative.   HENT: Negative.   Eyes: Negative.   Respiratory: Negative.   Cardiovascular: Negative.   Gastrointestinal: Positive for abdominal pain.  Endocrine: Negative.   Genitourinary: Positive for vaginal bleeding.  Musculoskeletal: Negative.   Skin: Negative.   Allergic/Immunologic: Negative.   Neurological: Negative.   Hematological: Negative.   Psychiatric/Behavioral: Negative.     Allergies  Naproxen  Home Medications    BP 116/80   Pulse 79   Temp 98.4 F (36.9 C)   Resp 16   Ht 5\' 4"  (1.626 m)   Wt 215 lb (97.5 kg)   LMP 03/27/2017   BMI 36.90 kg/m   Physical Exam  Constitutional: She is oriented to person, place, and time. She appears well-developed and well-nourished.  HENT:  Head: Normocephalic.  Eyes: Pupils are equal, round, and reactive to light.  Neck: Normal range of motion.  Cardiovascular: Normal rate, regular rhythm, normal heart sounds and intact distal pulses.   Pulmonary/Chest: Effort  normal and breath sounds normal.  Abdominal: Soft. Bowel sounds are normal.  Genitourinary: Vagina normal and uterus normal.  Genitourinary Comments: Scant amt dark vag bleeding.  Musculoskeletal: Normal range of motion.  Neurological: She is alert and oriented to person, place, and time. She has normal reflexes.  Skin: Skin is warm and dry.  Psychiatric: She has a normal mood and affect. Her behavior is normal. Judgment and thought content normal.    MAU Course  Procedures (including critical care time)  Labs Reviewed  URINALYSIS, ROUTINE W REFLEX MICROSCOPIC  HCG, QUANTITATIVE, PREGNANCY  POCT PREGNANCY, URINE   No results found.   1. Menses, irregular       MDM  UPT and Quant neg, exam totally normal. VSS. Will d/c home

## 2020-02-19 LAB — OB RESULTS CONSOLE RPR: RPR: NONREACTIVE

## 2020-02-19 LAB — OB RESULTS CONSOLE HIV ANTIBODY (ROUTINE TESTING): HIV: NONREACTIVE

## 2020-02-19 LAB — OB RESULTS CONSOLE RUBELLA ANTIBODY, IGM: Rubella: IMMUNE

## 2020-02-19 LAB — OB RESULTS CONSOLE HEPATITIS B SURFACE ANTIGEN: Hepatitis B Surface Ag: NEGATIVE

## 2020-03-26 DIAGNOSIS — Z3A14 14 weeks gestation of pregnancy: Secondary | ICD-10-CM | POA: Diagnosis not present

## 2020-03-26 DIAGNOSIS — O09292 Supervision of pregnancy with other poor reproductive or obstetric history, second trimester: Secondary | ICD-10-CM | POA: Diagnosis not present

## 2020-04-09 DIAGNOSIS — Z361 Encounter for antenatal screening for raised alphafetoprotein level: Secondary | ICD-10-CM | POA: Diagnosis not present

## 2020-05-01 DIAGNOSIS — Z363 Encounter for antenatal screening for malformations: Secondary | ICD-10-CM | POA: Diagnosis not present

## 2020-05-01 DIAGNOSIS — Z3A19 19 weeks gestation of pregnancy: Secondary | ICD-10-CM | POA: Diagnosis not present

## 2020-05-01 DIAGNOSIS — O09292 Supervision of pregnancy with other poor reproductive or obstetric history, second trimester: Secondary | ICD-10-CM | POA: Diagnosis not present

## 2020-05-15 DIAGNOSIS — Z3A21 21 weeks gestation of pregnancy: Secondary | ICD-10-CM | POA: Diagnosis not present

## 2020-05-15 DIAGNOSIS — O4442 Low lying placenta NOS or without hemorrhage, second trimester: Secondary | ICD-10-CM | POA: Diagnosis not present

## 2020-05-15 DIAGNOSIS — Z362 Encounter for other antenatal screening follow-up: Secondary | ICD-10-CM | POA: Diagnosis not present

## 2020-06-05 DIAGNOSIS — Z3A24 24 weeks gestation of pregnancy: Secondary | ICD-10-CM | POA: Diagnosis not present

## 2020-06-05 DIAGNOSIS — O4402 Placenta previa specified as without hemorrhage, second trimester: Secondary | ICD-10-CM | POA: Diagnosis not present

## 2020-06-26 DIAGNOSIS — O4402 Placenta previa specified as without hemorrhage, second trimester: Secondary | ICD-10-CM | POA: Diagnosis not present

## 2020-06-26 DIAGNOSIS — O09292 Supervision of pregnancy with other poor reproductive or obstetric history, second trimester: Secondary | ICD-10-CM | POA: Diagnosis not present

## 2020-06-26 DIAGNOSIS — Z3A27 27 weeks gestation of pregnancy: Secondary | ICD-10-CM | POA: Diagnosis not present

## 2020-06-26 DIAGNOSIS — Z23 Encounter for immunization: Secondary | ICD-10-CM | POA: Diagnosis not present

## 2020-06-26 DIAGNOSIS — O360130 Maternal care for anti-D [Rh] antibodies, third trimester, not applicable or unspecified: Secondary | ICD-10-CM | POA: Diagnosis not present

## 2020-06-26 DIAGNOSIS — Z3A37 37 weeks gestation of pregnancy: Secondary | ICD-10-CM | POA: Diagnosis not present

## 2020-06-26 DIAGNOSIS — Z3689 Encounter for other specified antenatal screening: Secondary | ICD-10-CM | POA: Diagnosis not present

## 2020-06-26 DIAGNOSIS — O360120 Maternal care for anti-D [Rh] antibodies, second trimester, not applicable or unspecified: Secondary | ICD-10-CM | POA: Diagnosis not present

## 2020-07-08 DIAGNOSIS — O360130 Maternal care for anti-D [Rh] antibodies, third trimester, not applicable or unspecified: Secondary | ICD-10-CM | POA: Diagnosis not present

## 2020-07-08 DIAGNOSIS — Z3A29 29 weeks gestation of pregnancy: Secondary | ICD-10-CM | POA: Diagnosis not present

## 2020-07-08 DIAGNOSIS — O4403 Placenta previa specified as without hemorrhage, third trimester: Secondary | ICD-10-CM | POA: Diagnosis not present

## 2020-07-08 DIAGNOSIS — O09293 Supervision of pregnancy with other poor reproductive or obstetric history, third trimester: Secondary | ICD-10-CM | POA: Diagnosis not present

## 2020-07-09 DIAGNOSIS — Z3689 Encounter for other specified antenatal screening: Secondary | ICD-10-CM | POA: Diagnosis not present

## 2020-07-23 DIAGNOSIS — Z3A31 31 weeks gestation of pregnancy: Secondary | ICD-10-CM | POA: Diagnosis not present

## 2020-07-23 DIAGNOSIS — O365930 Maternal care for other known or suspected poor fetal growth, third trimester, not applicable or unspecified: Secondary | ICD-10-CM | POA: Diagnosis not present

## 2020-08-20 DIAGNOSIS — Z3685 Encounter for antenatal screening for Streptococcus B: Secondary | ICD-10-CM | POA: Diagnosis not present

## 2020-08-20 LAB — OB RESULTS CONSOLE GBS
GBS: NEGATIVE
GBS: NEGATIVE

## 2020-08-24 NOTE — L&D Delivery Note (Signed)
Operative Delivery Note At 8:46 AM a viable and healthy female was delivered via Vaginal, Vacuum [employer] banker, operational).  Presentation: vertex; Position: Occiput,, Posterior; Station: +3.  Verbal consent: obtained from patient.  Non reassuring FHR tracing with known IUGR. Risks and benefits discussed in detail.  Risks include, but are not limited to the risks of anesthesia, bleeding, infection, damage to maternal tissues, fetal cephalhematoma.  There is also the risk of inability to effect vaginal delivery of the head, or shoulder dystocia that cannot be resolved by established maneuvers, leading to the need for emergency cesarean section.  APGAR: 9, 9; weight 4 lb 9.5 oz (2084 g).   Placenta status: spontaneous, intact.   Cord:  spontaneous with the following complications: none.  Cord pH: na  Anesthesia:  epidural Instruments: kiwi x 3 pulls, 2 pop offs Episiotomy: None Lacerations: None Suture Repair:  na Est. Blood Loss (mL): 200  Mom to postpartum.  Baby to Couplet care / Skin to Skin.  Farran Amsden J 09/03/2020, 10:21 AM

## 2020-09-03 ENCOUNTER — Inpatient Hospital Stay (HOSPITAL_COMMUNITY): Payer: BC Managed Care – PPO | Admitting: Anesthesiology

## 2020-09-03 ENCOUNTER — Inpatient Hospital Stay (HOSPITAL_COMMUNITY)
Admission: AD | Admit: 2020-09-03 | Discharge: 2020-09-05 | DRG: 807 | Disposition: A | Discharge status: Home / Self Care | Payer: BC Managed Care – PPO | Attending: Obstetrics and Gynecology | Admitting: Obstetrics and Gynecology

## 2020-09-03 ENCOUNTER — Encounter (HOSPITAL_COMMUNITY): Payer: Self-pay

## 2020-09-03 DIAGNOSIS — Z20822 Contact with and (suspected) exposure to covid-19: Secondary | ICD-10-CM | POA: Diagnosis present

## 2020-09-03 DIAGNOSIS — O365990 Maternal care for other known or suspected poor fetal growth, unspecified trimester, not applicable or unspecified: Secondary | ICD-10-CM | POA: Diagnosis present

## 2020-09-03 DIAGNOSIS — Z8759 Personal history of other complications of pregnancy, childbirth and the puerperium: Secondary | ICD-10-CM

## 2020-09-03 DIAGNOSIS — O365930 Maternal care for other known or suspected poor fetal growth, third trimester, not applicable or unspecified: Secondary | ICD-10-CM | POA: Diagnosis present

## 2020-09-03 DIAGNOSIS — Z6791 Unspecified blood type, Rh negative: Secondary | ICD-10-CM | POA: Diagnosis present

## 2020-09-03 DIAGNOSIS — O99214 Obesity complicating childbirth: Secondary | ICD-10-CM | POA: Diagnosis present

## 2020-09-03 DIAGNOSIS — O26893 Other specified pregnancy related conditions, third trimester: Secondary | ICD-10-CM | POA: Diagnosis present

## 2020-09-03 DIAGNOSIS — Z3A37 37 weeks gestation of pregnancy: Secondary | ICD-10-CM

## 2020-09-03 DIAGNOSIS — E669 Obesity, unspecified: Secondary | ICD-10-CM | POA: Diagnosis present

## 2020-09-03 LAB — COMPREHENSIVE METABOLIC PANEL
ALT: 15 U/L (ref 0–44)
AST: 18 U/L (ref 15–41)
Albumin: 2.9 g/dL — ABNORMAL LOW (ref 3.5–5.0)
Alkaline Phosphatase: 334 U/L — ABNORMAL HIGH (ref 38–126)
Anion gap: 10 (ref 5–15)
BUN: 12 mg/dL (ref 6–20)
CO2: 21 mmol/L — ABNORMAL LOW (ref 22–32)
Calcium: 9.2 mg/dL (ref 8.9–10.3)
Chloride: 106 mmol/L (ref 98–111)
Creatinine, Ser: 0.89 mg/dL (ref 0.44–1.00)
GFR, Estimated: >60 mL/min (ref >60)
Glucose, Bld: 87 mg/dL (ref 70–99)
Potassium: 3.9 mmol/L (ref 3.5–5.1)
Sodium: 137 mmol/L (ref 135–145)
Total Bilirubin: 0.5 mg/dL (ref 0.3–1.2)
Total Protein: 6.2 g/dL — ABNORMAL LOW (ref 6.5–8.1)

## 2020-09-03 LAB — CBC
HCT: 39.5 % (ref 36.0–46.0)
Hemoglobin: 13.2 g/dL (ref 12.0–15.0)
MCH: 28.3 pg (ref 26.0–34.0)
MCHC: 33.4 g/dL (ref 30.0–36.0)
MCV: 84.6 fL (ref 80.0–100.0)
Platelets: 263 10*3/uL (ref 150–400)
RBC: 4.67 MIL/uL (ref 3.87–5.11)
RDW: 13.9 % (ref 11.5–15.5)
WBC: 15.1 10*3/uL — ABNORMAL HIGH (ref 4.0–10.5)
nRBC: 0 % (ref 0.0–0.2)

## 2020-09-03 LAB — RESP PANEL BY RT-PCR (FLU A&B, COVID) ARPGX2
Influenza A by PCR: NEGATIVE
Influenza B by PCR: NEGATIVE
SARS Coronavirus 2 by RT PCR: NEGATIVE

## 2020-09-03 LAB — TYPE AND SCREEN
ABO/RH(D): O NEG
Antibody Screen: POSITIVE

## 2020-09-03 LAB — PROTEIN / CREATININE RATIO, URINE
Creatinine, Urine: 58.71 mg/dL
Protein Creatinine Ratio: 0.26 mg/mg{Cre} — ABNORMAL HIGH (ref 0.00–0.15)
Total Protein, Urine: 15 mg/dL

## 2020-09-03 LAB — RPR: RPR Ser Ql: NONREACTIVE

## 2020-09-03 MED ORDER — OXYTOCIN-SODIUM CHLORIDE 30-0.9 UT/500ML-% IV SOLN
2.5000 [IU]/h | INTRAVENOUS | Status: DC
  Filled 2020-09-03: qty 500

## 2020-09-03 MED ORDER — FLEET ENEMA 7-19 GM/118ML RE ENEM: 1.0000 | ENEMA | RECTAL | Status: DC | PRN

## 2020-09-03 MED ORDER — PHENYLEPHRINE 40 MCG/ML (10ML) SYRINGE FOR IV PUSH (FOR BLOOD PRESSURE SUPPORT): 80.0000 ug | PREFILLED_SYRINGE | INTRAVENOUS | Status: DC | PRN

## 2020-09-03 MED ORDER — EPHEDRINE 5 MG/ML INJ: 10.0000 mg | INTRAVENOUS | Status: DC | PRN

## 2020-09-03 MED ORDER — SENNOSIDES-DOCUSATE SODIUM 8.6-50 MG PO TABS
2.0000 | ORAL_TABLET | Freq: Every day | ORAL | Status: DC
  Administered 2020-09-04: 2 via ORAL
  Filled 2020-09-03: qty 2

## 2020-09-03 MED ORDER — ONDANSETRON HCL 4 MG/2ML IJ SOLN: 4.0000 mg | INTRAMUSCULAR | Status: DC | PRN

## 2020-09-03 MED ORDER — TETANUS-DIPHTH-ACELL PERTUSSIS 5-2.5-18.5 LF-MCG/0.5 IM SUSY: 0.5000 mL | PREFILLED_SYRINGE | Freq: Once | INTRAMUSCULAR | Status: DC

## 2020-09-03 MED ORDER — FENTANYL-BUPIVACAINE-NACL 0.5-0.125-0.9 MG/250ML-% EP SOLN
EPIDURAL | Status: AC
  Filled 2020-09-03: qty 250

## 2020-09-03 MED ORDER — FENTANYL-BUPIVACAINE-NACL 0.5-0.125-0.9 MG/250ML-% EP SOLN: 12.0000 mL/h | EPIDURAL | Status: DC | PRN

## 2020-09-03 MED ORDER — ONDANSETRON HCL 4 MG PO TABS: 4.0000 mg | ORAL_TABLET | ORAL | Status: DC | PRN

## 2020-09-03 MED ORDER — ACETAMINOPHEN 325 MG PO TABS: 650.0000 mg | ORAL_TABLET | ORAL | Status: DC | PRN

## 2020-09-03 MED ORDER — LIDOCAINE HCL (PF) 1 % IJ SOLN: 30.0000 mL | INTRAMUSCULAR | Status: DC | PRN

## 2020-09-03 MED ORDER — WITCH HAZEL-GLYCERIN EX PADS: 1.0000 "application " | MEDICATED_PAD | CUTANEOUS | Status: DC | PRN

## 2020-09-03 MED ORDER — SOD CITRATE-CITRIC ACID 500-334 MG/5ML PO SOLN: 30.0000 mL | ORAL | Status: DC | PRN

## 2020-09-03 MED ORDER — ONDANSETRON HCL 4 MG/2ML IJ SOLN: 4.0000 mg | Freq: Four times a day (QID) | INTRAMUSCULAR | Status: DC | PRN

## 2020-09-03 MED ORDER — LACTATED RINGERS IV SOLN: 500.0000 mL | Freq: Once | INTRAVENOUS | Status: DC

## 2020-09-03 MED ORDER — PRENATAL MULTIVITAMIN CH
1.0000 | ORAL_TABLET | Freq: Every day | ORAL | Status: DC
  Administered 2020-09-03 – 2020-09-04 (×2): 1 via ORAL
  Filled 2020-09-03 (×2): qty 1

## 2020-09-03 MED ORDER — OXYTOCIN BOLUS FROM INFUSION: 333.0000 mL | Freq: Once | INTRAVENOUS | Status: DC

## 2020-09-03 MED ORDER — METHYLERGONOVINE MALEATE 0.2 MG PO TABS: 0.2000 mg | ORAL_TABLET | ORAL | Status: DC | PRN

## 2020-09-03 MED ORDER — DIPHENHYDRAMINE HCL 50 MG/ML IJ SOLN: 12.5000 mg | INTRAMUSCULAR | Status: DC | PRN

## 2020-09-03 MED ORDER — LACTATED RINGERS IV SOLN: INTRAVENOUS | Status: DC

## 2020-09-03 MED ORDER — OXYCODONE-ACETAMINOPHEN 5-325 MG PO TABS: 2.0000 | ORAL_TABLET | ORAL | Status: DC | PRN

## 2020-09-03 MED ORDER — METHYLERGONOVINE MALEATE 0.2 MG/ML IJ SOLN: 0.2000 mg | INTRAMUSCULAR | Status: DC | PRN

## 2020-09-03 MED ORDER — LACTATED RINGERS IV SOLN: 500.0000 mL | INTRAVENOUS | Status: DC | PRN

## 2020-09-03 MED ORDER — IBUPROFEN 600 MG PO TABS
600.0000 mg | ORAL_TABLET | Freq: Four times a day (QID) | ORAL | Status: DC
  Administered 2020-09-03 – 2020-09-04 (×3): 600 mg via ORAL
  Filled 2020-09-03 (×5): qty 1

## 2020-09-03 MED ORDER — COCONUT OIL OIL: 1.0000 "application " | TOPICAL_OIL | Status: DC | PRN

## 2020-09-03 MED ORDER — BENZOCAINE-MENTHOL 20-0.5 % EX AERO: 1.0000 "application " | INHALATION_SPRAY | CUTANEOUS | Status: DC | PRN

## 2020-09-03 MED ORDER — SIMETHICONE 80 MG PO CHEW: 80.0000 mg | CHEWABLE_TABLET | ORAL | Status: DC | PRN

## 2020-09-03 MED ORDER — ZOLPIDEM TARTRATE 5 MG PO TABS: 5.0000 mg | ORAL_TABLET | Freq: Every evening | ORAL | Status: DC | PRN

## 2020-09-03 MED ORDER — DIPHENHYDRAMINE HCL 25 MG PO CAPS: 25.0000 mg | ORAL_CAPSULE | Freq: Four times a day (QID) | ORAL | Status: DC | PRN

## 2020-09-03 MED ORDER — OXYCODONE-ACETAMINOPHEN 5-325 MG PO TABS: 1.0000 | ORAL_TABLET | ORAL | Status: DC | PRN

## 2020-09-03 MED ORDER — DIBUCAINE (PERIANAL) 1 % EX OINT: 1.0000 "application " | TOPICAL_OINTMENT | CUTANEOUS | Status: DC | PRN

## 2020-09-03 NOTE — H&P (Signed)
Diana Kelley is a 35 y.o. female presenting for labor admit. OB History    Gravida  5   Para  3   Term  3   Preterm      AB  1   Living  3     SAB  1   IAB      Ectopic      Multiple      Live Births  3          Past Medical History:  Diagnosis Date  . Medical history non-contributory    Past Surgical History:  Procedure Laterality Date  . DILATION AND CURETTAGE OF UTERUS    . LAPAROSCOPY    . TONSILLECTOMY AND ADENOIDECTOMY    . WISDOM TOOTH EXTRACTION     Family History: family history is not on file. Social History:  reports that she has never smoked. She has never used smokeless tobacco. She reports that she does not drink alcohol and does not use drugs.     Maternal Diabetes: No Genetic Screening: Normal Maternal Ultrasounds/Referrals: IUGR Fetal Ultrasounds or other Referrals:  None Maternal Substance Abuse:  No Significant Maternal Medications:  None Significant Maternal Lab Results:  Group B Strep negative Other Comments:  None  Review of Systems  Constitutional: Negative.   All other systems reviewed and are negative.  Maternal Medical History:  Reason for admission: Contractions.   Contractions: Onset was 1-2 hours ago.   Frequency: regular.   Perceived severity is moderate.    Fetal activity: Perceived fetal activity is normal.   Last perceived fetal movement was within the past hour.    Prenatal complications: IUGR.   Prenatal Complications - Diabetes: none.    Dilation: 3 Effacement (%): 90 Station: -3 Exam by:: [employer] Blood pressure 123/65, pulse 75, temperature 97.9 F (36.6 C), temperature source Oral, resp. rate 15, unknown if currently breastfeeding. Maternal Exam:  Uterine Assessment: Contraction strength is moderate.  Contraction frequency is regular.   Abdomen: Patient reports no abdominal tenderness. Fetal presentation: vertex  Introitus: Normal vulva. Normal vagina.  Ferning test: not done.   Nitrazine test: not done. Amniotic fluid character: not assessed.  Pelvis: adequate for delivery.   Cervix: Cervix evaluated by digital exam.     Physical Exam Constitutional:      Appearance: Normal appearance. She is normal weight.  HENT:     Head: Normocephalic and atraumatic.  Cardiovascular:     Rate and Rhythm: Normal rate and regular rhythm.     Pulses: Normal pulses.     Heart sounds: Normal heart sounds.  Pulmonary:     Effort: Pulmonary effort is normal.     Breath sounds: Normal breath sounds.  Genitourinary:    General: Normal vulva.  Musculoskeletal:        General: Normal range of motion.     Cervical back: Normal range of motion and neck supple.  Skin:    General: Skin is warm.  Neurological:     General: No focal deficit present.     Mental Status: She is alert and oriented to person, place, and time.     Prenatal labs: ABO, Rh:  pos Antibody:  neg Rubella:  imm RPR:   neg HBsAg:   neg HIV:   neg GBS:   neg  Assessment/Plan: 37 wk IUP Severe IUGR-BMZ complete Early labor  Admit  Trisa Cranor J 09/03/2020, 7:45 AM

## 2020-09-03 NOTE — Anesthesia Procedure Notes (Signed)
Epidural Patient location during procedure: OB Start time: 09/03/2020 8:10 AM End time: 09/03/2020 8:18 AM  Staffing Anesthesiologist: [name], MD Performed: anesthesiologist   Preanesthetic Checklist Completed: patient identified, IV checked, site marked, risks and benefits discussed, monitors and equipment checked, pre-op evaluation and timeout performed  Epidural Patient position: sitting Prep: DuraPrep Patient monitoring: heart rate, cardiac monitor, continuous pulse ox and blood pressure Approach: midline Location: L2-L3 Injection technique: LOR saline  Needle:  Needle type: Tuohy  Needle gauge: 17 G Needle length: 9 cm Needle insertion depth: 6 cm Catheter type: closed end flexible Catheter size: 20 Guage Catheter at skin depth: 11 cm Test dose: negative and Other  Assessment Events: blood not aspirated, injection not painful, no injection resistance and negative IV test  Additional Notes Informed consent obtained prior to proceeding including risk of failure, 1% risk of PDPH, risk of minor discomfort and bruising.  Discussed rare but serious complications including epidural abscess, permanent nerve injury, epidural hematoma.  Discussed alternatives to epidural analgesia and patient desires to proceed.  Timeout performed pre-procedure verifying patient name, procedure, and platelet count.  Patient tolerated procedure well.

## 2020-09-03 NOTE — Anesthesia Preprocedure Evaluation (Signed)
Anesthesia Evaluation  Patient identified by MRN, date of birth, ID band Patient awake    Reviewed: Allergy & Precautions, H&P , Patient's Chart, lab work & pertinent test results  Airway Mallampati: III  TM Distance: >3 FB Neck ROM: Full    Dental no notable dental hx. (+) Teeth Intact   Pulmonary neg pulmonary ROS,    Pulmonary exam normal breath sounds clear to auscultation       Cardiovascular negative cardio ROS Normal cardiovascular exam Rhythm:Regular Rate:Normal     Neuro/Psych negative neurological ROS  negative psych ROS   GI/Hepatic negative GI ROS, Neg liver ROS,   Endo/Other  Obesity   Renal/GU negative Renal ROS  negative genitourinary   Musculoskeletal negative musculoskeletal ROS (+)   Abdominal (+) + obese,   Peds  Hematology negative hematology ROS (+)   Anesthesia Other Findings   Reproductive/Obstetrics (+) Pregnancy                             Anesthesia Physical  Anesthesia Plan  ASA: II  Anesthesia Plan: Epidural   Post-op Pain Management:    Induction:   PONV Risk Score and Plan:   Airway Management Planned: Natural Airway  Additional Equipment:   Intra-op Plan:   Post-operative Plan:   Informed Consent: I have reviewed the patients History and Physical, chart, labs and discussed the procedure including the risks, benefits and alternatives for the proposed anesthesia with the patient or authorized representative who has indicated his/her understanding and acceptance.       Plan Discussed with: Anesthesiologist  Anesthesia Plan Comments:         Anesthesia Quick Evaluation

## 2020-09-03 NOTE — MAU Note (Signed)
.   Diana Kelley is a 35 y.o. at Unknown here in MAU reporting: ctx that started at 0300. They became more regular around 0500 this morning. No VB or LOF. Endorses fetal movement.   Pain score: 8 Vitals:   09/03/20 0654  BP: (!) 135/94  Pulse: 80  Resp: 15  Temp: 97.9 F (36.6 C)     FHT:145

## 2020-09-04 ENCOUNTER — Other Ambulatory Visit: Payer: Self-pay

## 2020-09-04 ENCOUNTER — Inpatient Hospital Stay (HOSPITAL_COMMUNITY)
Admission: AD | Admit: 2020-09-04 | Payer: BC Managed Care – PPO | Source: Home / Self Care | Admitting: Obstetrics and Gynecology

## 2020-09-04 ENCOUNTER — Inpatient Hospital Stay (HOSPITAL_COMMUNITY): Payer: BC Managed Care – PPO

## 2020-09-04 LAB — CBC
HCT: 38.2 % (ref 36.0–46.0)
Hemoglobin: 12.2 g/dL (ref 12.0–15.0)
MCH: 27.5 pg (ref 26.0–34.0)
MCHC: 31.9 g/dL (ref 30.0–36.0)
MCV: 86 fL (ref 80.0–100.0)
Platelets: 237 10*3/uL (ref 150–400)
RBC: 4.44 MIL/uL (ref 3.87–5.11)
RDW: 14 % (ref 11.5–15.5)
WBC: 15 10*3/uL — ABNORMAL HIGH (ref 4.0–10.5)
nRBC: 0 % (ref 0.0–0.2)

## 2020-09-04 MED ORDER — RHO D IMMUNE GLOBULIN 1500 UNIT/2ML IJ SOSY
300.0000 ug | PREFILLED_SYRINGE | Freq: Once | INTRAMUSCULAR | Status: AC
  Administered 2020-09-04: 300 ug via INTRAVENOUS
  Filled 2020-09-04: qty 2

## 2020-09-04 NOTE — Progress Notes (Addendum)
PPD # 1 S/P VAVD  Live born female  Birth Weight: 4 lb 9.5 oz (2084 g) APGAR: 9, 9  Newborn Delivery   Birth date/time: 09/03/2020 08:46:00 Delivery type: Vaginal, Vacuum (Extractor)     Baby name: Diana Kelley  Delivering provider: Olivia Mackie  Episiotomy:None   Lacerations:None   Feeding: breast and bottle  Pain control at delivery: Epidural   S:  Reports feeling better today, denies PEC sx.              Tolerating po/ No nausea or vomiting             Bleeding is light             Pain controlled with ibuprofen (OTC)             Up ad lib / ambulatory / voiding without difficulties   O:  A & O x 3, in no apparent distress              VS:  Vitals:   09/03/20 1540 09/03/20 1953 09/03/20 2320 09/04/20 0507  BP: 124/86 (!) 130/95 (!) 146/91 (!) 122/94  Pulse: 80 79 70 76  Resp: 16 18 18 18   Temp: 98.9 F (37.2 C) 98 F (36.7 C) 98.2 F (36.8 C) 98.5 F (36.9 C)  TempSrc: Oral Axillary Oral Oral  SpO2: 99% 98% 96% 99%  Weight:      Height:        LABS:  Recent Labs    09/03/20 0722 09/04/20 0637  WBC 15.1* 15.0*  HGB 13.2 12.2  HCT 39.5 38.2  PLT 263 237    Blood type: --/--/O NEG Performed at [facility], [address], [place], [place]  [phone])  Rubella: Immune (06/28 0000)   I&O: I/O last 3 completed shifts: In: -  Out: 200 [Blood:200]          No intake/output data recorded.   Gen: AAO x 3, NAD  Abdomen: soft, non-tender, non-distended             Fundus: firm, non-tender, U-1  Perineum: intact  Lochia: scant  Extremities: no edema, no calf pain or tenderness    A/P: PPD # 1 34 y.o., 02-16-1976   Principal Problem:   Postpartum care following vaginal delivery 1/11 Active Problems:   Status post vacuum-assisted vaginal delivery Labile BP, no neural sx  - start Procardia 30 XL daily  - continue monitoring closely Rh neg, baby Rh pos  - Rhogam prior to DC   Doing well - stable status  Routine post partum  orders  Anticipate discharge tomorrow POC in consult w/ Dr. 3/11, MSN, CNM 09/04/2020, 9:41 AM

## 2020-09-04 NOTE — Lactation Note (Signed)
Lactation Consultation Note  Patient Name: GLEMA TAKAKI [id number] Date: 09/04/2020 Reason for consult: Other (Comment) (mom has LC order and per [facility] - mom declined LC assist and if needed may call . P 3, experienced . MBURN [employer] planned to write a note in the baby's chart.) Age:35 y.o.  Maternal Data    Feeding    LATCH Score                   Interventions    Lactation Tools Discussed/Used     Consult Status Consult Status: Complete Date: 09/04/20    Kathrin Greathouse 09/04/2020, 1:01 PM

## 2020-09-05 DIAGNOSIS — O365990 Maternal care for other known or suspected poor fetal growth, unspecified trimester, not applicable or unspecified: Secondary | ICD-10-CM | POA: Diagnosis present

## 2020-09-05 DIAGNOSIS — Z6791 Unspecified blood type, Rh negative: Secondary | ICD-10-CM | POA: Diagnosis present

## 2020-09-05 LAB — RH IG WORKUP (INCLUDES ABO/RH)
ABO/RH(D): O NEG
Fetal Screen: NEGATIVE
Gestational Age(Wks): 37.1
Unit division: 0

## 2020-09-05 MED ORDER — ACETAMINOPHEN 325 MG PO TABS: 650.0000 mg | ORAL_TABLET | ORAL | 1 refills | Status: DC | PRN

## 2020-09-05 MED ORDER — IBUPROFEN 600 MG PO TABS: 600.0000 mg | ORAL_TABLET | Freq: Four times a day (QID) | ORAL | 0 refills | Status: DC

## 2020-09-05 NOTE — Discharge Summary (Signed)
OB Discharge Summary  Patient Name: Diana Kelley DOB: [birth year] MRN: [id number]  Date of admission: 09/03/2020 Delivering provider: Olivia Mackie   Admitting diagnosis: Normal labor and delivery [O80] Intrauterine pregnancy: [redacted]w[redacted]d     Secondary diagnosis: Patient Active Problem List   Diagnosis Date Noted  . RhD negative 09/05/2020  . IUGR (intrauterine growth retardation), delivered, current hospitalization 09/05/2020  . Status post vacuum-assisted vaginal delivery 09/03/2020  . Postpartum care following vaginal delivery 1/11 09/03/2020     Date of discharge: 09/05/2020   Discharge diagnosis: Principal Problem:   Postpartum care following vaginal delivery 1/11 Active Problems:   Status post vacuum-assisted vaginal delivery   RhD negative   IUGR (intrauterine growth retardation), delivered, current hospitalization                                                              Post partum procedures:rhogam  Augmentation: AROM Pain control: Epidural  Laceration:None  Episiotomy:None  Complications: None  Hospital course:  Onset of Labor With Vaginal Delivery      35 y.o. yo [id number] at [redacted]w[redacted]d was admitted in Active Labor on 09/03/2020. Patient had an uncomplicated labor course as follows:  Membrane Rupture Time/Date: 8:31 AM ,09/03/2020   Delivery Method:Vaginal, Vacuum (Extractor)  Episiotomy: None  Lacerations:  None  Patient had an postpartum course complicated by labile blood pressures with normal labs. Denies PEC symptoms. Will follow-up in 1 week for blood pressure check. She is ambulating, tolerating a regular diet, passing flatus, and urinating well. Patient is discharged home in stable condition on 09/05/20.  Newborn Data: Birth date:09/03/2020  Birth time:8:46 AM  Gender:Female  Living status:Living  Apgars:9 ,9  Weight:2084 g   Physical exam  Vitals:   09/04/20 0507 09/04/20 1300 09/04/20 2104 09/05/20 0530  BP: (!) 122/94 110/83 120/80 118/79  Pulse: 76  82 80 84  Resp: 18 18 16    Temp: 98.5 F (36.9 C) 99.5 F (37.5 C) 99 F (37.2 C)   TempSrc: Oral Oral Oral   SpO2: 99%  99% 100%  Weight:      Height:       General: alert, cooperative and no distress Lochia: appropriate Uterine Fundus: firm Perineum: intact DVT Evaluation: No evidence of DVT seen on physical exam. No significant calf/ankle edema. Labs: Lab Results  Component Value Date   WBC 15.0 (H) 09/04/2020   HGB 12.2 09/04/2020   HCT 38.2 09/04/2020   MCV 86.0 09/04/2020   PLT 237 09/04/2020   CMP Latest Ref Rng & Units 09/03/2020  Glucose 70 - 99 mg/dL 87  BUN 6 - 20 mg/dL 12  Creatinine 11/01/2020 - 3.26 mg/dL 7.12  Sodium 4.58 - 099 mmol/L 137  Potassium 3.5 - 5.1 mmol/L 3.9  Chloride 98 - 111 mmol/L 106  CO2 22 - 32 mmol/L 21(L)  Calcium 8.9 - 10.3 mg/dL 9.2  Total Protein 6.5 - 8.1 g/dL 6.2(L)  Total Bilirubin 0.3 - 1.2 mg/dL 0.5  Alkaline Phos 38 - 126 U/L 334(H)  AST 15 - 41 U/L 18  ALT 0 - 44 U/L 15   Edinburgh Postnatal Depression Scale Screening Tool 09/03/2020  I have been able to laugh and see the funny side of things. 0  I have looked forward with enjoyment to things. 0  I have blamed myself unnecessarily when things went wrong. 0  I have been anxious or worried for no good reason. 0  I have felt scared or panicky for no good reason. 0  Things have been getting on top of me. 0  I have been so unhappy that I have had difficulty sleeping. 0  I have felt sad or miserable. 0  I have been so unhappy that I have been crying. 0  The thought of harming myself has occurred to me. 0  Edinburgh Postnatal Depression Scale Total 0   Vaccines: TDaP UTD         Flu    Declined         COVID-19   Declined  Discharge instructions:  per After Visit Summary and [facility] booklet  After Visit Meds:  Allergies as of 09/05/2020      Reactions   Naproxen Nausea And Vomiting      Medication List    STOP taking these medications   senna-docusate 8.6-50 MG  tablet Commonly known as: Senokot-S     TAKE these medications   acetaminophen 325 MG tablet Commonly known as: Tylenol Take 2 tablets (650 mg total) by mouth every 4 (four) hours as needed (for pain scale < 4).   ibuprofen 600 MG tablet Commonly known as: ADVIL Take 1 tablet (600 mg total) by mouth every 6 (six) hours.   prenatal multivitamin Tabs tablet Take 1 tablet by mouth daily at 12 noon.      Diet: routine diet  Activity: Advance as tolerated. Pelvic rest for 6 weeks.   Newborn Data: Live born female  Birth Weight: 4 lb 9.5 oz (2084 g) APGAR: 9, 9  Newborn Delivery   Birth date/time: 09/03/2020 08:46:00 Delivery type: Vaginal, Vacuum (Extractor)     Named Marilynne Halsted Baby Feeding: Bottle and Breast Disposition:home with mother  Delivery Report:  Review the Delivery Report for details.    Follow up:  Follow-up Information    Olivia Mackie, MD. Schedule an appointment as soon as possible for a visit in 1 week(s).   Specialty: Obstetrics and Gynecology Why: Please make an appointment for 1 week postpartum for a blood pressure check.  Contact information: [address] [place] [address] [phone]              June Leap, CNM, MSN 09/05/2020, 11:13 AM

## 2020-09-06 NOTE — Anesthesia Postprocedure Evaluation (Signed)
Anesthesia Post Note  Patient: Diana Kelley  Procedure(s) Performed: AN AD HOC LABOR EPIDURAL     Patient location during evaluation: Mother Baby Anesthesia Type: Epidural Level of consciousness: awake and alert Pain management: pain level controlled Vital Signs Assessment: post-procedure vital signs reviewed and stable Respiratory status: spontaneous breathing, nonlabored ventilation and respiratory function stable Cardiovascular status: stable Postop Assessment: no headache, no backache and able to ambulate Anesthetic complications: no   No complications documented.  Last Vitals: There were no vitals filed for this visit.  Last Pain: There were no vitals filed for this visit. Pain Goal:                   [name]

## 2020-09-23 ENCOUNTER — Inpatient Hospital Stay (HOSPITAL_COMMUNITY): Admit: 2020-09-23 | Payer: Self-pay

## 2020-09-26 ENCOUNTER — Ambulatory Visit: Payer: Self-pay | Admitting: Podiatry

## 2020-09-27 ENCOUNTER — Encounter: Payer: Self-pay | Admitting: Sports Medicine

## 2020-09-27 ENCOUNTER — Ambulatory Visit: Payer: BC Managed Care – PPO | Admitting: Sports Medicine

## 2020-09-27 ENCOUNTER — Other Ambulatory Visit: Payer: Self-pay

## 2020-09-27 DIAGNOSIS — L600 Ingrowing nail: Secondary | ICD-10-CM | POA: Diagnosis not present

## 2020-09-27 DIAGNOSIS — M79675 Pain in left toe(s): Secondary | ICD-10-CM

## 2020-09-27 DIAGNOSIS — M79674 Pain in right toe(s): Secondary | ICD-10-CM

## 2020-09-27 DIAGNOSIS — Z8759 Personal history of other complications of pregnancy, childbirth and the puerperium: Secondary | ICD-10-CM | POA: Insufficient documentation

## 2020-09-27 NOTE — Progress Notes (Signed)
Subjective: MARRIETTA THUNDER is a 35 y.o. female patient presents to office today complaining of a moderately painful incurvated, medial and lateral nail borders of the 1st toe on the right and left foot. This has been present for years.  Patient has treated this by digging them out. Patient denies fever/chills/nausea/vomitting/any other related constitutional symptoms at this time.  Reports that she had her baby last month.   Review of Systems  All other systems reviewed and are negative.    Patient Active Problem List   Diagnosis Date Noted  . History of miscarriage 09/27/2020  . RhD negative 09/05/2020  . IUGR (intrauterine growth retardation), delivered, current hospitalization 09/05/2020  . Status post vacuum-assisted vaginal delivery 09/03/2020  . Postpartum care following vaginal delivery 1/11 09/03/2020    Current Outpatient Medications on File Prior to Visit  Medication Sig Dispense Refill  . SUMAtriptan (IMITREX) 50 MG tablet 1 tablet with onset of headache may repeat in 2 hours limited to in 24 hours    . acetaminophen (TYLENOL) 325 MG tablet Take 2 tablets (650 mg total) by mouth every 4 (four) hours as needed (for pain scale < 4). 30 tablet 1  . amitriptyline (ELAVIL) 25 MG tablet amitriptyline 25 mg tablet    . amoxicillin-clavulanate (AUGMENTIN) 875-125 MG tablet amoxicillin 875 mg-potassium clavulanate 125 mg tablet    . azithromycin (ZITHROMAX) 250 MG tablet Take 250 mg by mouth as directed.    . benzonatate (TESSALON) 100 MG capsule benzonatate 100 mg capsule    . betamethasone acetate-betamethasone sodium phosphate (CELESTONE) 6 (3-3) MG/ML injection betamethasone acetate and sodium phos 6 mg/mL suspension for injection  Take 2.08 mL every day by injection route as needed for 2 days.    . Butalbital-APAP-Caffeine 50-325-40 MG capsule butalbital-acetaminophen-caffeine 50 mg-325 mg-40 mg capsule  Take 1 capsule every 4 hours by oral route.    . calcium carbonate  (TUMS) 500 MG chewable tablet Tums 200 mg calcium (500 mg) chewable tablet  Take by oral route.    . clarithromycin (BIAXIN XL) 500 MG 24 hr tablet clarithromycin ER 500 mg tablet,extended release 24 hr    . Diclofenac Potassium,Migraine, (CAMBIA) 50 MG PACK Cambia 50 mg oral powder packet    . Doxylamine-Pyridoxine (DICLEGIS) 10-10 MG TBEC Diclegis 10 mg-10 mg tablet,delayed release  Take 2 tablet(s) EVERY DAY by oral route at bedtime. If nausea persists add 1 tablet in the morning and 1 in the afternoon    . fexofenadine-pseudoephedrine (ALLEGRA-D ALLERGY & CONGESTION) 60-120 MG 12 hr tablet Allegra-D 12 Hour 60 mg-120 mg tablet,extended release  TAKE 1 TABLET BY MOUTH TWICE A DAY    . fluconazole (DIFLUCAN) 150 MG tablet fluconazole 150 mg tablet    . ibuprofen (ADVIL) 600 MG tablet Take 1 tablet (600 mg total) by mouth every 6 (six) hours. 30 tablet 0  . Magnesium Oxide 250 MG TABS magnesium 250 mg (as magnesium oxide) tablet  Take by oral route.    . nystatin ointment (MYCOSTATIN) nystatin 100,000 unit/gram topical ointment    . ondansetron (ZOFRAN) 4 MG tablet ondansetron HCl 4 mg tablet    . ondansetron (ZOFRAN-ODT) 8 MG disintegrating tablet ondansetron 8 mg disintegrating tablet    . oseltamivir (TAMIFLU) 75 MG capsule Tamiflu 75 mg capsule    . phentermine (ADIPEX-P) 37.5 MG tablet phentermine 37.5 mg tablet    . predniSONE (DELTASONE) 20 MG tablet prednisone 20 mg tablet    . Prenatal Vit-Fe Fumarate-FA (PRENATAL MULTIVITAMIN) TABS tablet Take 1  tablet by mouth daily at 12 noon.    . Prenatal Vit-Min-FA-Fish Oil ([facility] PRENATAL GUMMY) 0.4-113.5 MG CHEW Chew by mouth.    . progesterone (PROMETRIUM) 200 MG capsule progesterone micronized 200 mg capsule    . promethazine (PHENERGAN) 12.5 MG tablet promethazine 12.5 mg tablet    . rho, d, immune globulin (RHOPHYLAC) 1500 UNIT/2ML SOSY injection Rhophylac 1,500 unit (300 mcg)/2 mL injection syringe  Take 1500 units by injection route.     . triamcinolone ointment (KENALOG) 0.1 % triamcinolone acetonide 0.1 % topical ointment    . zolpidem (AMBIEN) 10 MG tablet zolpidem 10 mg tablet     No current facility-administered medications on file prior to visit.    Allergies  Allergen Reactions  . Naproxen Nausea And Vomiting    Objective:  There were no vitals filed for this visit.  General: Well developed, nourished, in no acute distress, alert and oriented x3   Dermatology: Skin is warm, dry and supple bilateral. Right and Left hallux nail appears to be incurvated with hyperkeratosis formation at the distal aspects of the medial and lateral nail border. (-) Erythema. (+) Edema. (-) serosanguous  drainage present. The remaining nails appear unremarkable at this time. There are no open sores, lesions or other signs of infection  present.  Vascular: Dorsalis Pedis artery and Posterior Tibial artery pedal pulses are 2/4 bilateral with immedate capillary fill time. Pedal hair growth present. No lower extremity edema.   Neruologic: Grossly intact via light touch bilateral.  Musculoskeletal: Tenderness to palpation of the right and left hallux nail fold(s). Muscular strength within normal limits in all groups bilateral.   Assesement and Plan: Problem List Items Addressed This Visit   None   Visit Diagnoses    Ingrown nail    -  Primary   Toe pain, bilateral          -Discussed treatment alternatives and plan of care; Explained permanent/temporary nail avulsion and post procedure course to patient. Patient elects for PNA right and left hallux medial lateral borders with use of phenol - After a verbal and written consent, injected 6 ml of a 50:50 mixture of 2% plain  lidocaine and 0.5% plain marcaine in a normal hallux block fashion. Next, a  betadine prep was performed. Anesthesia was tested and found to be appropriate.  The offending right and left hallux medial and lateral nail border was then incised from the  hyponychium to the epinychium. The offending nail border was removed and cleared from the field. The area was curretted for any remaining nail or spicules. Phenol application performed and the area was then flushed with alcohol and dressed with antibiotic cream and a dry sterile dressing. -Patient was instructed to leave the dressing intact for today and begin soaking  in a weak solution of betadine or Epsom salt and water tomorrow. Patient was instructed to  soak for 15-20 minutes each day and apply neosporin/corticosporin and a gauze or bandaid dressing each day. -Patient was instructed to monitor the toe for signs of infection and return to office if toe becomes red, hot or swollen. -Advised ice, elevation, and tylenol or motrin if needed for pain.  -Patient is to return in 2 weeks for follow up care/nail check or sooner if problems arise.  Asencion Islam, DPM

## 2020-09-27 NOTE — Patient Instructions (Signed)

## 2020-10-11 ENCOUNTER — Ambulatory Visit (INDEPENDENT_AMBULATORY_CARE_PROVIDER_SITE_OTHER): Payer: BC Managed Care – PPO | Admitting: Sports Medicine

## 2020-10-11 ENCOUNTER — Other Ambulatory Visit: Payer: Self-pay

## 2020-10-11 ENCOUNTER — Encounter: Payer: Self-pay | Admitting: Sports Medicine

## 2020-10-11 DIAGNOSIS — Z9889 Other specified postprocedural states: Secondary | ICD-10-CM

## 2020-10-11 DIAGNOSIS — M79674 Pain in right toe(s): Secondary | ICD-10-CM

## 2020-10-11 DIAGNOSIS — M79675 Pain in left toe(s): Secondary | ICD-10-CM

## 2020-10-11 NOTE — Progress Notes (Signed)
Subjective: Diana Kelley is a 35 y.o. female patient returns to office today for follow up evaluation after having Right/Left Hallux medial/lateral permanent/temporary nail avulsion performed on (09/27/20). Patient has been soaking using epsom salt and applying topical antibiotic covered with bandaid daily. Reports she is doing ok seems like left is taking a little longer to heal. Patient deniesfever/chills/nausea/vomitting/any other related constitutional symptoms at this time.  Patient Active Problem List   Diagnosis Date Noted  . History of miscarriage 09/27/2020  . RhD negative 09/05/2020  . IUGR (intrauterine growth retardation), delivered, current hospitalization 09/05/2020  . Status post vacuum-assisted vaginal delivery 09/03/2020  . Postpartum care following vaginal delivery 1/11 09/03/2020    Current Outpatient Medications on File Prior to Visit  Medication Sig Dispense Refill  . acetaminophen (TYLENOL) 325 MG tablet Take 2 tablets (650 mg total) by mouth every 4 (four) hours as needed (for pain scale < 4). 30 tablet 1  . amitriptyline (ELAVIL) 25 MG tablet amitriptyline 25 mg tablet    . amoxicillin-clavulanate (AUGMENTIN) 875-125 MG tablet amoxicillin 875 mg-potassium clavulanate 125 mg tablet    . azithromycin (ZITHROMAX) 250 MG tablet Take 250 mg by mouth as directed.    . benzonatate (TESSALON) 100 MG capsule benzonatate 100 mg capsule    . betamethasone acetate-betamethasone sodium phosphate (CELESTONE) 6 (3-3) MG/ML injection betamethasone acetate and sodium phos 6 mg/mL suspension for injection  Take 2.08 mL every day by injection route as needed for 2 days.    . Butalbital-APAP-Caffeine 50-325-40 MG capsule butalbital-acetaminophen-caffeine 50 mg-325 mg-40 mg capsule  Take 1 capsule every 4 hours by oral route.    . calcium carbonate (TUMS) 500 MG chewable tablet Tums 200 mg calcium (500 mg) chewable tablet  Take by oral route.    . clarithromycin (BIAXIN XL) 500 MG 24  hr tablet clarithromycin ER 500 mg tablet,extended release 24 hr    . Diclofenac Potassium,Migraine, (CAMBIA) 50 MG PACK Cambia 50 mg oral powder packet    . Doxylamine-Pyridoxine (DICLEGIS) 10-10 MG TBEC Diclegis 10 mg-10 mg tablet,delayed release  Take 2 tablet(s) EVERY DAY by oral route at bedtime. If nausea persists add 1 tablet in the morning and 1 in the afternoon    . fexofenadine-pseudoephedrine (ALLEGRA-D ALLERGY & CONGESTION) 60-120 MG 12 hr tablet Allegra-D 12 Hour 60 mg-120 mg tablet,extended release  TAKE 1 TABLET BY MOUTH TWICE A DAY    . fluconazole (DIFLUCAN) 150 MG tablet fluconazole 150 mg tablet    . ibuprofen (ADVIL) 600 MG tablet Take 1 tablet (600 mg total) by mouth every 6 (six) hours. 30 tablet 0  . Magnesium Oxide 250 MG TABS magnesium 250 mg (as magnesium oxide) tablet  Take by oral route.    . nystatin ointment (MYCOSTATIN) nystatin 100,000 unit/gram topical ointment    . ondansetron (ZOFRAN) 4 MG tablet ondansetron HCl 4 mg tablet    . ondansetron (ZOFRAN-ODT) 8 MG disintegrating tablet ondansetron 8 mg disintegrating tablet    . oseltamivir (TAMIFLU) 75 MG capsule Tamiflu 75 mg capsule    . phentermine (ADIPEX-P) 37.5 MG tablet phentermine 37.5 mg tablet    . predniSONE (DELTASONE) 20 MG tablet prednisone 20 mg tablet    . Prenatal Vit-Fe Fumarate-FA (PRENATAL MULTIVITAMIN) TABS tablet Take 1 tablet by mouth daily at 12 noon.    . Prenatal Vit-Min-FA-Fish Oil ([facility] PRENATAL GUMMY) 0.4-113.5 MG CHEW Chew by mouth.    . progesterone (PROMETRIUM) 200 MG capsule progesterone micronized 200 mg capsule    .  promethazine (PHENERGAN) 12.5 MG tablet promethazine 12.5 mg tablet    . rho, d, immune globulin (RHOPHYLAC) 1500 UNIT/2ML SOSY injection Rhophylac 1,500 unit (300 mcg)/2 mL injection syringe  Take 1500 units by injection route.    . SUMAtriptan (IMITREX) 50 MG tablet 1 tablet with onset of headache may repeat in 2 hours limited to in 24 hours    . triamcinolone  ointment (KENALOG) 0.1 % triamcinolone acetonide 0.1 % topical ointment    . zolpidem (AMBIEN) 10 MG tablet zolpidem 10 mg tablet     No current facility-administered medications on file prior to visit.    Allergies  Allergen Reactions  . Naproxen Nausea And Vomiting    Objective:  General: Well developed, nourished, in no acute distress, alert and oriented x3   Dermatology: Skin is warm, dry and supple bilateral. Left and Right hallux medial/lateral nail beds appears to be clean, dry, with mild granular tissue and surrounding eschar/scab. Minimal faint Erythema. (-) Edema. (-) serosanguous drainage present. The remaining nails appear unremarkable at this time. There are no other lesions or other signs of infection present.  Neurovascular status: Intact. No lower extremity swelling; No pain with calf compression bilateral.   Musculoskeletal: Decreased tenderness to palpation of the Left and right hallux nail fold(s). Muscular strength within normal limits bilateral.   Assesement and Plan: Problem List Items Addressed This Visit   None   Visit Diagnoses    S/P nail surgery    -  Primary   Toe pain, bilateral          -Examined patient  -Cleansed right/left hallux medial/lateral nail fold and gently scrubbed with peroxide and q-tip/curetted away eschar at site and applied antibiotic cream covered with bandaid.  -Discussed plan of care with patient. -Patient to now begin soaking in a weak solution of Epsom salt and warm water. Patient was instructed to soak for 15-20 minutes each day until the toe appears normal and there is no drainage, redness, tenderness, or swelling at the procedure site, and apply neosporin and a gauze or bandaid dressing each day as needed for approximately 1 more week. May leave open to air at night. -Educated patient on long term care after nail surgery. -Patient was instructed to monitor the toe for reoccurrence and signs of infection; Patient advised to  return to office or go to ER if toe becomes red, hot or swollen. -Patient is to return as needed or sooner if problems arise.  Asencion Islam, DPM

## 2020-11-20 ENCOUNTER — Other Ambulatory Visit: Payer: Self-pay

## 2020-11-20 ENCOUNTER — Encounter: Payer: Self-pay | Admitting: Sports Medicine

## 2020-11-20 ENCOUNTER — Ambulatory Visit (INDEPENDENT_AMBULATORY_CARE_PROVIDER_SITE_OTHER): Payer: BC Managed Care – PPO | Admitting: Sports Medicine

## 2020-11-20 DIAGNOSIS — M79674 Pain in right toe(s): Secondary | ICD-10-CM

## 2020-11-20 DIAGNOSIS — Z9889 Other specified postprocedural states: Secondary | ICD-10-CM

## 2020-11-20 DIAGNOSIS — L600 Ingrowing nail: Secondary | ICD-10-CM

## 2020-11-20 NOTE — Progress Notes (Signed)
Subjective: Diana Kelley is a 35 y.o. female patient presents to office today complaining of a moderately painful incurvated, red, hot, swollen lateral nail border of the 1st toe on the right foot. This has been present for slowly getting worse again after last procedure last month. Patient has treated this by soaking and using ointment without relief.  Patient denies fever/chills/nausea/vomitting/any other related constitutional symptoms at this time.  Patient Active Problem List   Diagnosis Date Noted  . History of miscarriage 09/27/2020  . RhD negative 09/05/2020  . IUGR (intrauterine growth retardation), delivered, current hospitalization 09/05/2020  . Status post vacuum-assisted vaginal delivery 09/03/2020  . Postpartum care following vaginal delivery 1/11 09/03/2020    Current Outpatient Medications on File Prior to Visit  Medication Sig Dispense Refill  . acetaminophen (TYLENOL) 325 MG tablet Take 2 tablets (650 mg total) by mouth every 4 (four) hours as needed (for pain scale < 4). 30 tablet 1  . amitriptyline (ELAVIL) 25 MG tablet amitriptyline 25 mg tablet    . amoxicillin-clavulanate (AUGMENTIN) 875-125 MG tablet amoxicillin 875 mg-potassium clavulanate 125 mg tablet    . azithromycin (ZITHROMAX) 250 MG tablet Take 250 mg by mouth as directed.    . benzonatate (TESSALON) 100 MG capsule benzonatate 100 mg capsule    . betamethasone acetate-betamethasone sodium phosphate (CELESTONE) 6 (3-3) MG/ML injection betamethasone acetate and sodium phos 6 mg/mL suspension for injection  Take 2.08 mL every day by injection route as needed for 2 days.    . Butalbital-APAP-Caffeine 50-325-40 MG capsule butalbital-acetaminophen-caffeine 50 mg-325 mg-40 mg capsule  Take 1 capsule every 4 hours by oral route.    . calcium carbonate (TUMS) 500 MG chewable tablet Tums 200 mg calcium (500 mg) chewable tablet  Take by oral route.    . clarithromycin (BIAXIN XL) 500 MG 24 hr tablet clarithromycin  ER 500 mg tablet,extended release 24 hr    . Diclofenac Potassium,Migraine, (CAMBIA) 50 MG PACK Cambia 50 mg oral powder packet    . Doxylamine-Pyridoxine (DICLEGIS) 10-10 MG TBEC Diclegis 10 mg-10 mg tablet,delayed release  Take 2 tablet(s) EVERY DAY by oral route at bedtime. If nausea persists add 1 tablet in the morning and 1 in the afternoon    . fexofenadine-pseudoephedrine (ALLEGRA-D ALLERGY & CONGESTION) 60-120 MG 12 hr tablet Allegra-D 12 Hour 60 mg-120 mg tablet,extended release  TAKE 1 TABLET BY MOUTH TWICE A DAY    . fluconazole (DIFLUCAN) 150 MG tablet fluconazole 150 mg tablet    . ibuprofen (ADVIL) 600 MG tablet Take 1 tablet (600 mg total) by mouth every 6 (six) hours. 30 tablet 0  . Magnesium Oxide 250 MG TABS magnesium 250 mg (as magnesium oxide) tablet  Take by oral route.    . nystatin ointment (MYCOSTATIN) nystatin 100,000 unit/gram topical ointment    . ondansetron (ZOFRAN) 4 MG tablet ondansetron HCl 4 mg tablet    . ondansetron (ZOFRAN-ODT) 8 MG disintegrating tablet ondansetron 8 mg disintegrating tablet    . oseltamivir (TAMIFLU) 75 MG capsule Tamiflu 75 mg capsule    . phentermine (ADIPEX-P) 37.5 MG tablet phentermine 37.5 mg tablet    . predniSONE (DELTASONE) 20 MG tablet prednisone 20 mg tablet    . Prenatal Vit-Fe Fumarate-FA (PRENATAL MULTIVITAMIN) TABS tablet Take 1 tablet by mouth daily at 12 noon.    . Prenatal Vit-Min-FA-Fish Oil ([facility] PRENATAL GUMMY) 0.4-113.5 MG CHEW Chew by mouth.    . progesterone (PROMETRIUM) 200 MG capsule progesterone micronized 200 mg capsule    .  promethazine (PHENERGAN) 12.5 MG tablet promethazine 12.5 mg tablet    . rho, d, immune globulin (RHOPHYLAC) 1500 UNIT/2ML SOSY injection Rhophylac 1,500 unit (300 mcg)/2 mL injection syringe  Take 1500 units by injection route.    . SUMAtriptan (IMITREX) 50 MG tablet 1 tablet with onset of headache may repeat in 2 hours limited to in 24 hours    . triamcinolone ointment (KENALOG) 0.1 %  triamcinolone acetonide 0.1 % topical ointment    . zolpidem (AMBIEN) 10 MG tablet zolpidem 10 mg tablet     No current facility-administered medications on file prior to visit.    Allergies  Allergen Reactions  . Naproxen Nausea And Vomiting    Objective:  There were no vitals filed for this visit.  General: Well developed, nourished, in no acute distress, alert and oriented x3   Dermatology: Skin is warm, dry and supple bilateral.  Right hallux nail appears to be moderately incurvated with hyperkeratosis formation at the distal aspects of the lateral nail border. (+) Erythema. (+) Edema. (-) serosanguous  drainage present. The remaining nails appear unremarkable at this time. There are no open sores, lesions or other signs of infection  present.  Vascular: Dorsalis Pedis artery and Posterior Tibial artery pedal pulses are 2/4 bilateral with immedate capillary fill time. Pedal hair growth present. No lower extremity edema.   Neruologic: Grossly intact via light touch bilateral.  Musculoskeletal: Tenderness to palpation of the right hallux lateral nail fold. Muscular strength within normal limits in all groups bilateral.   Assesement and Plan: Problem List Items Addressed This Visit   None   Visit Diagnoses    S/P nail surgery    -  Primary   Toe pain, right       Ingrown nail       recurrent       -Discussed treatment alternatives and plan of care; Explained permanent/temporary nail avulsion and post procedure course to patient. Patient elects for repeat PNA right lateral hallux nail margin with phenol - After a verbal and written consent, injected 3 ml of a 50:50 mixture of 2% plain  lidocaine and 0.5% plain marcaine in a normal hallux block fashion. Next, a  betadine prep was performed. Anesthesia was tested and found to be appropriate.  The offending right hallux lateral nail border was then incised from the hyponychium to the epinychium. The offending nail border was  removed and cleared from the field. The area was curretted for any remaining nail or spicules. Phenol application performed and the area was then flushed with alcohol and dressed with antibiotic cream and a dry sterile dressing. -Patient was instructed to leave the dressing intact for today and begin soaking  in a weak solution of betadine or Epsom salt and water tomorrow. Patient was instructed to  soak for 15-20 minutes each day and apply neosporin/corticosporin and a gauze or bandaid dressing each day. -Patient was instructed to monitor the toe for signs of infection and return to office if toe becomes red, hot or swollen. -Advised ice, elevation, and tylenol or motrin if needed for pain.  -Patient is to return if fails to continue to improve after procedure of which I did today at no charge since this is a recurrent issue and she did not do better with the procedure she had 1 month ago or sooner if problems arise.  Asencion Islam, DPM

## 2022-04-07 ENCOUNTER — Other Ambulatory Visit (HOSPITAL_BASED_OUTPATIENT_CLINIC_OR_DEPARTMENT_OTHER): Payer: Self-pay

## 2022-04-07 MED ORDER — WEGOVY 0.25 MG/0.5ML ~~LOC~~ SOAJ
SUBCUTANEOUS | 0 refills | Status: DC
  Filled 2022-04-07 – 2022-06-18 (×3): qty 2, 28d supply, fill #0

## 2022-04-09 ENCOUNTER — Encounter (HOSPITAL_BASED_OUTPATIENT_CLINIC_OR_DEPARTMENT_OTHER): Payer: Self-pay

## 2022-04-09 ENCOUNTER — Other Ambulatory Visit (HOSPITAL_BASED_OUTPATIENT_CLINIC_OR_DEPARTMENT_OTHER): Payer: Self-pay

## 2022-04-29 ENCOUNTER — Other Ambulatory Visit (HOSPITAL_BASED_OUTPATIENT_CLINIC_OR_DEPARTMENT_OTHER): Payer: Self-pay

## 2022-06-18 ENCOUNTER — Other Ambulatory Visit (HOSPITAL_BASED_OUTPATIENT_CLINIC_OR_DEPARTMENT_OTHER): Payer: Self-pay

## 2022-08-24 NOTE — L&D Delivery Note (Addendum)
Delivery Note At 2:08 PM, subsequent to successful ECV, a viable and healthy female was delivered via Vaginal, Spontaneous (Presentation: Left Occiput Anterior).  APGAR: 8, 9; weight 5 lb 3.3 oz (2360 g).   Placenta status: Spontaneous, Intact.  Cord: 3 vessels with the following complications:  none.  Cord pH: na  Anesthesia: Epidural Episiotomy: None Lacerations: None Suture Repair:  na Est. Blood Loss (mL): 192  Mom to postpartum.  Baby to Couplet care / Skin to Skin.  Joyceann Kruser J 04/22/2023, 7:29 AM

## 2022-10-26 LAB — OB RESULTS CONSOLE RPR: RPR: NONREACTIVE

## 2022-10-26 LAB — OB RESULTS CONSOLE HEPATITIS B SURFACE ANTIGEN: Hepatitis B Surface Ag: NEGATIVE

## 2022-10-26 LAB — OB RESULTS CONSOLE RUBELLA ANTIBODY, IGM: Rubella: IMMUNE

## 2022-10-26 LAB — OB RESULTS CONSOLE VARICELLA ZOSTER ANTIBODY, IGG: Varicella: IMMUNE

## 2022-10-26 LAB — OB RESULTS CONSOLE ABO/RH: RH Type: NEGATIVE

## 2022-10-26 LAB — OB RESULTS CONSOLE HIV ANTIBODY (ROUTINE TESTING): HIV: NONREACTIVE

## 2022-11-30 LAB — OB RESULTS CONSOLE GC/CHLAMYDIA
Chlamydia: NEGATIVE
Neisseria Gonorrhea: NEGATIVE

## 2023-03-31 LAB — OB RESULTS CONSOLE GBS: GBS: POSITIVE

## 2023-04-21 ENCOUNTER — Ambulatory Visit (HOSPITAL_COMMUNITY): Admit: 2023-04-21 | Payer: BC Managed Care – PPO

## 2023-04-21 ENCOUNTER — Inpatient Hospital Stay (HOSPITAL_COMMUNITY): Payer: BC Managed Care – PPO | Admitting: Anesthesiology

## 2023-04-21 ENCOUNTER — Encounter (HOSPITAL_COMMUNITY): Payer: Self-pay | Admitting: General Practice

## 2023-04-21 ENCOUNTER — Inpatient Hospital Stay (HOSPITAL_COMMUNITY)
Admission: AD | Admit: 2023-04-21 | Discharge: 2023-04-23 | DRG: 807 | Disposition: A | Discharge status: Home / Self Care | Payer: BC Managed Care – PPO | Attending: Obstetrics and Gynecology | Admitting: Obstetrics and Gynecology

## 2023-04-21 DIAGNOSIS — Z23 Encounter for immunization: Secondary | ICD-10-CM | POA: Diagnosis not present

## 2023-04-21 DIAGNOSIS — Z3490 Encounter for supervision of normal pregnancy, unspecified, unspecified trimester: Secondary | ICD-10-CM

## 2023-04-21 DIAGNOSIS — O26893 Other specified pregnancy related conditions, third trimester: Secondary | ICD-10-CM | POA: Diagnosis present

## 2023-04-21 DIAGNOSIS — O321XX0 Maternal care for breech presentation, not applicable or unspecified: Secondary | ICD-10-CM | POA: Diagnosis present

## 2023-04-21 DIAGNOSIS — O99824 Streptococcus B carrier state complicating childbirth: Secondary | ICD-10-CM | POA: Diagnosis present

## 2023-04-21 DIAGNOSIS — Z3A36 36 weeks gestation of pregnancy: Secondary | ICD-10-CM | POA: Diagnosis not present

## 2023-04-21 DIAGNOSIS — O365930 Maternal care for other known or suspected poor fetal growth, third trimester, not applicable or unspecified: Principal | ICD-10-CM | POA: Diagnosis present

## 2023-04-21 DIAGNOSIS — Z6791 Unspecified blood type, Rh negative: Secondary | ICD-10-CM | POA: Diagnosis not present

## 2023-04-21 LAB — CBC
HCT: 36.2 % (ref 36.0–46.0)
Hemoglobin: 11.4 g/dL — ABNORMAL LOW (ref 12.0–15.0)
MCH: 26.4 pg (ref 26.0–34.0)
MCHC: 31.5 g/dL (ref 30.0–36.0)
MCV: 83.8 fL (ref 80.0–100.0)
Platelets: 282 10*3/uL (ref 150–400)
RBC: 4.32 MIL/uL (ref 3.87–5.11)
RDW: 14 % (ref 11.5–15.5)
WBC: 12.4 10*3/uL — ABNORMAL HIGH (ref 4.0–10.5)
nRBC: 0 % (ref 0.0–0.2)

## 2023-04-21 LAB — RPR: RPR Ser Ql: NONREACTIVE

## 2023-04-21 MED ORDER — OXYTOCIN-SODIUM CHLORIDE 30-0.9 UT/500ML-% IV SOLN: 1.0000 m[IU]/min | INTRAVENOUS | Status: DC

## 2023-04-21 MED ORDER — ONDANSETRON HCL 4 MG/2ML IJ SOLN
4.0000 mg | Freq: Four times a day (QID) | INTRAMUSCULAR | Status: DC | PRN
  Administered 2023-04-21: 4 mg via INTRAVENOUS

## 2023-04-21 MED ORDER — BUPIVACAINE LIPOSOME 1.3 % IJ SUSP
INTRAMUSCULAR | Status: AC
  Filled 2023-04-21: qty 20

## 2023-04-21 MED ORDER — OXYCODONE-ACETAMINOPHEN 5-325 MG PO TABS: 2.0000 | ORAL_TABLET | ORAL | Status: DC | PRN

## 2023-04-21 MED ORDER — LACTATED RINGERS IV SOLN: INTRAVENOUS | Status: DC

## 2023-04-21 MED ORDER — WITCH HAZEL-GLYCERIN EX PADS: 1.0000 | MEDICATED_PAD | CUTANEOUS | Status: DC | PRN

## 2023-04-21 MED ORDER — SODIUM CHLORIDE 0.9 % IV SOLN: 2.0000 g | Freq: Once | INTRAVENOUS | Status: AC

## 2023-04-21 MED ORDER — LACTATED RINGERS IV SOLN: 500.0000 mL | INTRAVENOUS | Status: DC | PRN

## 2023-04-21 MED ORDER — LIDOCAINE HCL (PF) 1 % IJ SOLN: 30.0000 mL | INTRAMUSCULAR | Status: DC | PRN

## 2023-04-21 MED ORDER — EPHEDRINE 5 MG/ML INJ: 10.0000 mg | INTRAVENOUS | Status: DC | PRN

## 2023-04-21 MED ORDER — TETANUS-DIPHTH-ACELL PERTUSSIS 5-2.5-18.5 LF-MCG/0.5 IM SUSY: 0.5000 mL | PREFILLED_SYRINGE | Freq: Once | INTRAMUSCULAR | Status: DC

## 2023-04-21 MED ORDER — SOD CITRATE-CITRIC ACID 500-334 MG/5ML PO SOLN: 30.0000 mL | ORAL | Status: DC | PRN

## 2023-04-21 MED ORDER — FLEET ENEMA RE ENEM: 1.0000 | ENEMA | Freq: Every day | RECTAL | Status: DC | PRN

## 2023-04-21 MED ORDER — LACTATED RINGERS IV SOLN: 500.0000 mL | Freq: Once | INTRAVENOUS | Status: DC

## 2023-04-21 MED ORDER — FENTANYL CITRATE (PF) 100 MCG/2ML IJ SOLN
INTRAMUSCULAR | Status: DC | PRN
  Administered 2023-04-21: 15 ug via INTRATHECAL

## 2023-04-21 MED ORDER — SENNOSIDES-DOCUSATE SODIUM 8.6-50 MG PO TABS
2.0000 | ORAL_TABLET | ORAL | Status: DC
  Administered 2023-04-21 – 2023-04-22 (×2): 2 via ORAL
  Filled 2023-04-21 (×2): qty 2

## 2023-04-21 MED ORDER — DIPHENHYDRAMINE HCL 50 MG/ML IJ SOLN: 12.5000 mg | INTRAMUSCULAR | Status: DC | PRN

## 2023-04-21 MED ORDER — OXYCODONE-ACETAMINOPHEN 5-325 MG PO TABS: 1.0000 | ORAL_TABLET | ORAL | Status: DC | PRN

## 2023-04-21 MED ORDER — OXYTOCIN-SODIUM CHLORIDE 30-0.9 UT/500ML-% IV SOLN: 2.5000 [IU]/h | INTRAVENOUS | Status: DC

## 2023-04-21 MED ORDER — SIMETHICONE 80 MG PO CHEW: 80.0000 mg | CHEWABLE_TABLET | ORAL | Status: DC | PRN

## 2023-04-21 MED ORDER — BENZOCAINE-MENTHOL 20-0.5 % EX AERO: 1.0000 | INHALATION_SPRAY | CUTANEOUS | Status: DC | PRN

## 2023-04-21 MED ORDER — ONDANSETRON HCL 4 MG/2ML IJ SOLN: 4.0000 mg | INTRAMUSCULAR | Status: DC | PRN

## 2023-04-21 MED ORDER — FENTANYL CITRATE (PF) 100 MCG/2ML IJ SOLN
INTRAMUSCULAR | Status: AC
  Filled 2023-04-21: qty 2

## 2023-04-21 MED ORDER — ONDANSETRON HCL 4 MG/2ML IJ SOLN
INTRAMUSCULAR | Status: AC
  Filled 2023-04-21: qty 2

## 2023-04-21 MED ORDER — ACETAMINOPHEN 325 MG PO TABS: 650.0000 mg | ORAL_TABLET | ORAL | Status: DC | PRN

## 2023-04-21 MED ORDER — PRENATAL MULTIVITAMIN CH
1.0000 | ORAL_TABLET | Freq: Every day | ORAL | Status: DC
  Filled 2023-04-21: qty 1

## 2023-04-21 MED ORDER — ONDANSETRON HCL 4 MG/2ML IJ SOLN: 4.0000 mg | Freq: Four times a day (QID) | INTRAMUSCULAR | Status: DC | PRN

## 2023-04-21 MED ORDER — ZOLPIDEM TARTRATE 5 MG PO TABS: 5.0000 mg | ORAL_TABLET | Freq: Every evening | ORAL | Status: DC | PRN

## 2023-04-21 MED ORDER — ONDANSETRON HCL 4 MG PO TABS: 4.0000 mg | ORAL_TABLET | ORAL | Status: DC | PRN

## 2023-04-21 MED ORDER — SODIUM CHLORIDE 0.9 % IV SOLN
1.0000 g | INTRAVENOUS | Status: DC
  Administered 2023-04-21: 1 g via INTRAVENOUS
  Filled 2023-04-21: qty 1000

## 2023-04-21 MED ORDER — COCONUT OIL OIL: 1.0000 | TOPICAL_OIL | Status: DC | PRN

## 2023-04-21 MED ORDER — IBUPROFEN 600 MG PO TABS
600.0000 mg | ORAL_TABLET | Freq: Four times a day (QID) | ORAL | Status: DC
  Filled 2023-04-21 (×6): qty 1

## 2023-04-21 MED ORDER — BUPIVACAINE HCL (PF) 0.25 % IJ SOLN
INTRAMUSCULAR | Status: AC
  Filled 2023-04-21: qty 10

## 2023-04-21 MED ORDER — TERBUTALINE SULFATE 1 MG/ML IJ SOLN: 0.2500 mg | Freq: Once | INTRAMUSCULAR | Status: DC

## 2023-04-21 MED ORDER — FENTANYL-BUPIVACAINE-NACL 0.5-0.125-0.9 MG/250ML-% EP SOLN: 12.0000 mL/h | EPIDURAL | Status: DC | PRN

## 2023-04-21 MED ORDER — SODIUM CHLORIDE 0.9 % IV SOLN: 1.0000 g | INTRAVENOUS | Status: DC

## 2023-04-21 MED ORDER — DIPHENHYDRAMINE HCL 25 MG PO CAPS: 25.0000 mg | ORAL_CAPSULE | Freq: Four times a day (QID) | ORAL | Status: DC | PRN

## 2023-04-21 MED ORDER — PHENYLEPHRINE 80 MCG/ML (10ML) SYRINGE FOR IV PUSH (FOR BLOOD PRESSURE SUPPORT)
80.0000 ug | PREFILLED_SYRINGE | INTRAVENOUS | Status: DC | PRN
  Filled 2023-04-21: qty 10

## 2023-04-21 MED ORDER — PHENYLEPHRINE 80 MCG/ML (10ML) SYRINGE FOR IV PUSH (FOR BLOOD PRESSURE SUPPORT): 80.0000 ug | PREFILLED_SYRINGE | INTRAVENOUS | Status: DC | PRN

## 2023-04-21 MED ORDER — OXYTOCIN BOLUS FROM INFUSION: 333.0000 mL | Freq: Once | INTRAVENOUS | Status: DC

## 2023-04-21 MED ORDER — SODIUM CHLORIDE 0.9 % IV SOLN
2.0000 g | Freq: Once | INTRAVENOUS | Status: AC
  Administered 2023-04-21: 2 g via INTRAVENOUS
  Filled 2023-04-21: qty 2000

## 2023-04-21 MED ORDER — BISACODYL 10 MG RE SUPP: 10.0000 mg | Freq: Every day | RECTAL | Status: DC | PRN

## 2023-04-21 MED ORDER — OXYTOCIN BOLUS FROM INFUSION
333.0000 mL | Freq: Once | INTRAVENOUS | Status: AC
  Administered 2023-04-21: 333 mL via INTRAVENOUS

## 2023-04-21 MED ORDER — ACETAMINOPHEN 500 MG PO TABS
1000.0000 mg | ORAL_TABLET | Freq: Four times a day (QID) | ORAL | Status: DC
  Administered 2023-04-21 – 2023-04-23 (×4): 1000 mg via ORAL
  Filled 2023-04-21 (×7): qty 2

## 2023-04-21 MED ORDER — FENTANYL-BUPIVACAINE-NACL 0.5-0.125-0.9 MG/250ML-% EP SOLN
12.0000 mL/h | EPIDURAL | Status: DC | PRN
  Administered 2023-04-21: 5 mL/h via EPIDURAL
  Filled 2023-04-21: qty 250

## 2023-04-21 MED ORDER — OXYTOCIN-SODIUM CHLORIDE 30-0.9 UT/500ML-% IV SOLN
INTRAVENOUS | Status: AC
  Administered 2023-04-21: 2 m[IU]/min via INTRAVENOUS
  Filled 2023-04-21: qty 500

## 2023-04-21 MED ORDER — FLEET ENEMA RE ENEM: 1.0000 | ENEMA | RECTAL | Status: DC | PRN

## 2023-04-21 MED ORDER — TERBUTALINE SULFATE 1 MG/ML IJ SOLN
INTRAMUSCULAR | Status: AC
  Administered 2023-04-21: 0.25 mg
  Filled 2023-04-21: qty 1

## 2023-04-21 MED ORDER — DIBUCAINE (PERIANAL) 1 % EX OINT: 1.0000 | TOPICAL_OINTMENT | CUTANEOUS | Status: DC | PRN

## 2023-04-21 MED ORDER — BUPIVACAINE HCL (PF) 0.25 % IJ SOLN
INTRAMUSCULAR | Status: DC | PRN
  Administered 2023-04-21: 1 mL via INTRATHECAL

## 2023-04-21 MED ORDER — TERBUTALINE SULFATE 1 MG/ML IJ SOLN: 0.2500 mg | Freq: Once | INTRAMUSCULAR | Status: DC | PRN

## 2023-04-21 NOTE — Progress Notes (Signed)
Procedure note: Consent done for ECV and Cesarean section BP 127/70   Pulse (!) 111   Temp 98.3 F (36.8 C) (Oral)   Resp 18   Ht 5\' 4"  (1.626 m)   Wt 103.4 kg   SpO2 96%   BMI 39.14 kg/m   Category 1 tracing Pt comfortable with epidural. Foley indwelling. ECV successful with forward roll to vertex position (from RUQ) AROM with minimal fluid return. VE 5cm /70/-2. Abdominal binder placed. Will transfer back to LD and start Pitocin augmentation. Ampicillin completed at 0950.

## 2023-04-21 NOTE — H&P (Addendum)
Diana Kelley is a 37 y.o. female presenting for labor.Seen in office and was 5cm.Upon presentation to LD, breech suspected by Leop[place] and VE, sono confirmed. OB History     Gravida  6   Para  4   Term  4   Preterm      AB  1   Living  4      SAB  1   IAB      Ectopic      Multiple  0   Live Births  4          Past Medical History:  Diagnosis Date   Medical history non-contributory    Past Surgical History:  Procedure Laterality Date   DILATION AND CURETTAGE OF UTERUS     LAPAROSCOPY     TONSILLECTOMY AND ADENOIDECTOMY     WISDOM TOOTH EXTRACTION     Family History: family history is not on file. Social History:  reports that she has never smoked. She has never used smokeless tobacco. She reports that she does not drink alcohol and does not use drugs.     Maternal Diabetes: No Genetic Screening: Normal Maternal Ultrasounds/Referrals: IUGR vs SGA. History of SGA fetus. Fetal Ultrasounds or other Referrals:  None Maternal Substance Abuse:  No Significant Maternal Medications:  None Significant Maternal Lab Results:  Group B Strep positive(urine) Number of Prenatal Visits:greater than 3 verified prenatal visits Other Comments:  None  Review of Systems  Constitutional: Negative.   All other systems reviewed and are negative.  Maternal Medical History:  Reason for admission: Contractions.   Contractions: Onset was 3-5 hours ago.   Frequency: regular.   Perceived severity is moderate.   Fetal activity: Perceived fetal activity is normal.   Last perceived fetal movement was within the past hour.   Prenatal complications: IUGR and preterm labor.   Prenatal Complications - Diabetes: none.   Dilation: 5 Station: Ballotable Exam by:: Dr. Billy Coast Blood pressure 129/83, pulse 91, temperature 98.3 F (36.8 C), temperature source Oral, resp. rate 18, height 5\' 4"  (1.626 m), weight 103.4 kg, SpO2 98%, unknown if currently breastfeeding. Maternal Exam:   Uterine Assessment: Contraction strength is moderate.  Contraction frequency is irregular.  Abdomen: Patient reports no abdominal tenderness. Fetal presentation: breech Introitus: Normal vulva. Normal vagina.  Ferning test: not done.  Nitrazine test: not done. Amniotic fluid character: not assessed. Pelvis: adequate for delivery.   Cervix: Cervix evaluated by digital exam.     Physical Exam Constitutional:      Appearance: Normal appearance. She is obese.  HENT:     Head: Normocephalic and atraumatic.  Cardiovascular:     Rate and Rhythm: Normal rate and regular rhythm.     Pulses: Normal pulses.     Heart sounds: Normal heart sounds.  Pulmonary:     Effort: Pulmonary effort is normal.     Breath sounds: Normal breath sounds.  Abdominal:     General: Bowel sounds are normal.     Palpations: Abdomen is soft.  Genitourinary:    General: Normal vulva.  Musculoskeletal:        General: Normal range of motion.     Cervical back: Normal range of motion and neck supple.  Skin:    General: Skin is warm and dry.  Neurological:     General: No focal deficit present.     Mental Status: She is alert and oriented to person, place, and time.  Psychiatric:  Mood and Affect: Mood normal.        Behavior: Behavior normal.     Prenatal labs: ABO, Rh: --/--/PENDING (08/28 9563) Antibody: PENDING (08/28 0905) Rubella:  imm RPR:   neg HBsAg:   neg HIV:   neg GBS:   pos in urine  Assessment/Plan: 36wk IUP IUGR vs SGA (last EFW 22nd percentile) PTL Breech malpresentation GBS bacteruria  Admit IV amp. Will attempt ECV after epidural. If successful, transfer back to LD. If unsuccessful , proceed with csection. Consent done.    Bronnie Vasseur J 04/21/2023, 9:41 AM

## 2023-04-21 NOTE — Lactation Note (Signed)
This note was copied from a baby's chart. Lactation Consultation Note  Patient Name: Girl Ikran Newhouse [id number] Date: 04/21/2023 Age:37 hours  Per RN, Birth Parent decline LC services on the [place].    Maternal Data    Feeding    LATCH Score                    Lactation Tools Discussed/Used    Interventions    Discharge    Consult Status      Frederico Hamman 04/21/2023, 8:05 PM

## 2023-04-21 NOTE — Progress Notes (Signed)
Diana Kelley is a 37 y.o. [phone] at [redacted]w[redacted]d by LMP admitted for active labor  Subjective: Comfortable with epidural  Objective: BP 121/74   Pulse 100   Temp 98.3 F (36.8 C) (Oral)   Resp 18   Ht 5\' 4"  (1.626 m)   Wt 103.4 kg   SpO2 96%   BMI 39.14 kg/m  No intake/output data recorded. No intake/output data recorded.  FHT:  FHR: 145 bpm, variability: moderate,  accelerations:  Present,  decelerations:  Absent UC:   regular, every 2=4 minutes SVE:   6+/70/-2 Bulging bag- arom clear IUPC placed without difficulty  Labs: Lab Results  Component Value Date   WBC 12.4 (H) 04/21/2023   HGB 11.4 (L) 04/21/2023   HCT 36.2 04/21/2023   MCV 83.8 04/21/2023   PLT 282 04/21/2023    Assessment / Plan: Induction of labor due to PTL,  progressing well on pitocin  Labor: Progressing normally Preeclampsia:  no signs or symptoms of toxicity Fetal Wellbeing:  Category I Pain Control:  Epidural I/D:  n/a Anticipated MOD:  NSVD  Lenoard Aden, MD 04/21/2023, 12:39 PM

## 2023-04-21 NOTE — Anesthesia Preprocedure Evaluation (Signed)
Anesthesia Evaluation  Patient identified by MRN, date of birth, ID band Patient awake    Reviewed: Allergy & Precautions, NPO status , Patient's Chart, lab work & pertinent test results  History of Anesthesia Complications Negative for: history of anesthetic complications  Airway Mallampati: II   Neck ROM: Full    Dental   Pulmonary neg pulmonary ROS   Pulmonary exam normal        Cardiovascular negative cardio ROS Normal cardiovascular exam     Neuro/Psych negative neurological ROS  negative psych ROS   GI/Hepatic negative GI ROS, Neg liver ROS,,,  Endo/Other  negative endocrine ROS    Renal/GU negative Renal ROS     Musculoskeletal negative musculoskeletal ROS (+)    Abdominal   Peds  Hematology negative hematology ROS (+)   Anesthesia Other Findings   Reproductive/Obstetrics (+) Pregnancy                             Anesthesia Physical Anesthesia Plan  ASA: 2  Anesthesia Plan: Combined Spinal and Epidural   Post-op Pain Management:    Induction:   PONV Risk Score and Plan: 2 and Treatment may vary due to age or medical condition  Airway Management Planned: Natural Airway  Additional Equipment: None  Intra-op Plan:   Post-operative Plan:   Informed Consent: I have reviewed the patients History and Physical, chart, labs and discussed the procedure including the risks, benefits and alternatives for the proposed anesthesia with the patient or authorized representative who has indicated his/her understanding and acceptance.       Plan Discussed with: Anesthesiologist  Anesthesia Plan Comments: (Fetus in transverse lie, OB requesting neuraxial for attempt at version with plans for vaginal delivery if successful. Patient actively laboring. Labs reviewed. Platelets acceptable, patient not taking any blood thinning medications. Per RN, FHR tracing reported to be stable  enough for sitting procedure. Risks and benefits discussed with patient, including PDPH, backache, epidural hematoma, failed epidural, blood pressure changes, allergic reaction, and nerve injury. Patient expressed understanding and wished to proceed.)       Anesthesia Quick Evaluation

## 2023-04-21 NOTE — Anesthesia Procedure Notes (Signed)
Epidural Patient location during procedure: OB Start time: 04/21/2023 10:08 AM End time: 04/21/2023 10:12 AM  Staffing Anesthesiologist: Beryle Lathe, MD Performed: anesthesiologist   Preanesthetic Checklist Completed: patient identified, IV checked, risks and benefits discussed, surgical consent, monitors and equipment checked, pre-op evaluation and timeout performed  Epidural Patient position: sitting Prep: DuraPrep Patient monitoring: continuous pulse ox and blood pressure Approach: midline Location: L3-L4 Injection technique: LOR air  Needle:  Needle type: Tuohy  Needle gauge: 17 G Needle length: 9 cm Needle insertion depth: 6 cm Catheter type: closed end flexible Catheter size: 19 Gauge Catheter at skin depth: 11 cm Epidural test dose: No test dose given due to administration of intrathecal medication.  Assessment Events: blood not aspirated, injection not painful, no injection resistance, no paresthesia and negative IV test  Additional Notes Patient identified. Risks including, but not limited to, bleeding, infection, nerve damage, paralysis, inadequate analgesia, blood pressure changes, nausea, vomiting, allergic reaction, postpartum back pain, itching, and headache were discussed. Patient expressed understanding and wished to proceed. Sterile prep and drape, including hand hygiene, mask, and sterile gloves were used. The patient was positioned and the spine was prepped. The skin was anesthetized with lidocaine. The Tuohy was advanced until LOR was achieved. A 25ga Whitacre needle was advanced through the Tuohy. Free flow of clear CSF was obtained prior to injecting local anesthetic into the CSF. The spinal needle aspirated freely following injection. The spinal needle was carefully withdrawn. The epidural catheter was then advanced into the epidural space via the Tuohy needle. The Tuohy needle was then withdrawn and the epidural catheter was taped into place. No  paraesthesia or other complications noted. The patient tolerated the procedure well.   Leslye Peer, MDReason for block:at surgeon's request

## 2023-04-22 LAB — CBC
HCT: 32 % — ABNORMAL LOW (ref 36.0–46.0)
Hemoglobin: 10.2 g/dL — ABNORMAL LOW (ref 12.0–15.0)
MCH: 26.4 pg (ref 26.0–34.0)
MCHC: 31.9 g/dL (ref 30.0–36.0)
MCV: 82.7 fL (ref 80.0–100.0)
Platelets: 247 10*3/uL (ref 150–400)
RBC: 3.87 MIL/uL (ref 3.87–5.11)
RDW: 14.4 % (ref 11.5–15.5)
WBC: 13.4 10*3/uL — ABNORMAL HIGH (ref 4.0–10.5)
nRBC: 0 % (ref 0.0–0.2)

## 2023-04-22 MED ORDER — RHO D IMMUNE GLOBULIN 1500 UNIT/2ML IJ SOSY
300.0000 ug | PREFILLED_SYRINGE | Freq: Once | INTRAMUSCULAR | Status: AC
  Administered 2023-04-22: 300 ug via INTRAVENOUS
  Filled 2023-04-22: qty 2

## 2023-04-22 NOTE — Anesthesia Postprocedure Evaluation (Signed)
Anesthesia Post Note  Patient: Diana Kelley  Procedure(s) Performed: AN AD HOC LABOR EPIDURAL     Patient location during evaluation: Mother Baby Anesthesia Type: Epidural Level of consciousness: awake and alert Pain management: pain level controlled Vital Signs Assessment: post-procedure vital signs reviewed and stable Respiratory status: spontaneous breathing, nonlabored ventilation and respiratory function stable Cardiovascular status: stable Postop Assessment: no headache, no backache and epidural receding Anesthetic complications: no   No notable events documented.  Last Vitals:  Vitals:   04/22/23 0106 04/22/23 0519  BP: 94/73 110/68  Pulse: 77 78  Resp: 17 16  Temp: 37 C 36.7 C  SpO2:      Last Pain:  Vitals:   04/22/23 0957  TempSrc:   PainSc: 0-No pain   Pain Goal:                   Terrina Docter

## 2023-04-22 NOTE — Progress Notes (Signed)
       PPD # 1 S/P NSVD  Live born female  Birth Weight: 5 lb 3.3 oz (2360 g) APGAR: 8, 9  Newborn Delivery   Birth date/time: 04/21/2023 14:08:00 Delivery type: Vaginal, Spontaneous    Baby name: undecided Delivering provider: Olivia Mackie Episiotomy:None  Lacerations:None  Feeding: breast  Pain control at delivery: Epidural  S:  Reports feeling well.             Tolerating po/ No nausea or vomiting             Bleeding is light             Pain controlled with acetaminophen and ibuprofen (OTC)             Up ad lib / ambulatory / voiding without difficulties   O:  A & O x 3, in no apparent distress              VS:  Vitals:   04/21/23 1748 04/21/23 2109 04/22/23 0106 04/22/23 0519  BP: 122/67 106/67 94/73 110/68  Pulse: (!) 102 90 77 78  Resp: 18 16 17 16   Temp: 99.2 F (37.3 C) 98.8 F (37.1 C) 98.6 F (37 C) 98 F (36.7 C)  TempSrc: Oral Oral Oral Oral  SpO2: 99%     Weight:      Height:        LABS:  Recent Labs    04/21/23 0901 04/22/23 0600  WBC 12.4* 13.4*  HGB 11.4* 10.2*  HCT 36.2 32.0*  PLT 282 247    Blood type: --/--/O NEG (08/28 0905)  Rubella: Immune (03/04 0000)   I&O: I/O last 3 completed shifts: In: -  Out: 692 [Urine:500; Blood:192]          No intake/output data recorded.   Gen: AAO x 3, NAD  Abdomen: soft, non-tender, non-distended             Fundus: firm, non-tender, U-1  Perineum: intact  Lochia: small  Extremities: no edema, no calf pain or tenderness    A/P: PPD # 1 37 y.o., [id number]   Principal Problem:   Postpartum care following vaginal delivery 8/28 Active Problems:   SVD (spontaneous vaginal delivery) PTB 36 wks   RhD negative   Pregnancy  Newborn Rh positive, will give Rhogam  Doing well - stable status  Routine post partum orders  Anticipate discharge tomorrow    Diana Mends, MSN, CNM 04/22/2023, 8:57 AM

## 2023-04-23 ENCOUNTER — Other Ambulatory Visit: Payer: Self-pay

## 2023-04-23 LAB — RH IG WORKUP (INCLUDES ABO/RH)
Fetal Screen: NEGATIVE
Gestational Age(Wks): 36
Unit division: 0

## 2023-04-23 MED ORDER — IBUPROFEN 600 MG PO TABS: 600.0000 mg | ORAL_TABLET | Freq: Four times a day (QID) | ORAL | 0 refills | Status: AC

## 2023-04-23 MED ORDER — ACETAMINOPHEN 500 MG PO TABS: 1000.0000 mg | ORAL_TABLET | Freq: Four times a day (QID) | ORAL | 0 refills | Status: AC

## 2023-04-23 NOTE — Discharge Summary (Signed)
Postpartum Discharge Summary    Patient Name: Diana Kelley DOB: 1985-10-03 MRN: [id number]  Date of admission: 04/21/2023 Delivery date:04/21/2023 Delivering provider: Olivia Mackie Date of discharge: 04/23/2023  Admitting diagnosis: Preterm labor, unengaged, uncertain presentation, [redacted]w[redacted]d     Secondary diagnosis:  Breech in labor Rh negative  Multiparity  GBS +     Discharge diagnosis: Intrapartum external cephalic version, Preterm Pregnancy Delivered                                               Post partum procedures:rhogam Augmentation: AROM and Pitocin Complications: None  Hospital course: Onset of Labor With Vaginal Delivery      37 y.o. yo [id number] at [redacted]w[redacted]d was admitted in Active Labor on 04/21/2023. Labor course was complicated by unengaged breech, Baby was cephalic per sono at 34 wks and on exam last week. External cephalic version done and baby and mom tolerated well   Membrane Rupture Time/Date: 10:35 AM,04/21/2023  Delivery Method:Vaginal, Spontaneous Operative Delivery:N/A Episiotomy: None Lacerations:  None Patient had a postpartum course complicated by none.  She is ambulating, tolerating a regular diet, passing flatus, and urinating well. Patient is discharged home in stable condition on 04/23/23.  Newborn Data: Birth date:04/21/2023 Birth time:2:08 PM Gender:Female Living status:Living Apgars:8 ,9  Weight:2360 g  Magnesium Sulfate received: NA BMZ received:  none Rhophylac:Yes MMR:No T-DaP: NA Flu: N/A Transfusion:No  Physical exam  Vitals:   04/22/23 0106 04/22/23 0519 04/23/23 0044 04/23/23 0511  BP: 94/73 110/68 111/75 (!) 132/93  Pulse: 77 78 68 76  Resp: 17 16 17 18   Temp: 98.6 F (37 C) 98 F (36.7 C) 97.9 F (36.6 C) 98.1 F (36.7 C)  TempSrc: Oral Oral Oral Oral  SpO2:   99% 99%  Weight:      Height:       General: alert and cooperative Lochia: appropriate Uterine Fundus: firm Incision: N/A DVT Evaluation: No evidence of  DVT seen on physical exam. Negative Homan's sign. Labs: Lab Results  Component Value Date   WBC 13.4 (H) 04/22/2023   HGB 10.2 (L) 04/22/2023   HCT 32.0 (L) 04/22/2023   MCV 82.7 04/22/2023   PLT 247 04/22/2023      Latest Ref Rng & Units 09/03/2020    7:36 AM  CMP  Glucose 70 - 99 mg/dL 87   BUN 6 - 20 mg/dL 12   Creatinine 4.40 - 1.00 mg/dL 3.47   Sodium 425 - 956 mmol/L 137   Potassium 3.5 - 5.1 mmol/L 3.9   Chloride 98 - 111 mmol/L 106   CO2 22 - 32 mmol/L 21   Calcium 8.9 - 10.3 mg/dL 9.2   Total Protein 6.5 - 8.1 g/dL 6.2   Total Bilirubin 0.3 - 1.2 mg/dL 0.5   Alkaline Phos 38 - 126 U/L 334   AST 15 - 41 U/L 18   ALT 0 - 44 U/L 15    Edinburgh Score:    04/22/2023    9:57 AM  Edinburgh Postnatal Depression Scale Screening Tool  I have been able to laugh and see the funny side of things. 0  I have looked forward with enjoyment to things. 0  I have blamed myself unnecessarily when things went wrong. 0  I have been anxious or worried for no good reason. 0  I have felt scared  or panicky for no good reason. 0  Things have been getting on top of me. 0  I have been so unhappy that I have had difficulty sleeping. 0  I have felt sad or miserable. 0  I have been so unhappy that I have been crying. 0  The thought of harming myself has occurred to me. 0  Edinburgh Postnatal Depression Scale Total 0      After visit meds:  Allergies as of 04/23/2023       Reactions   Naproxen Nausea And Vomiting        Medication List     TAKE these medications    acetaminophen 500 MG tablet Commonly known as: TYLENOL Take 2 tablets (1,000 mg total) by mouth every 6 (six) hours.   Allegra-D Allergy & Congestion 60-120 MG 12 hr tablet Generic drug: fexofenadine-pseudoephedrine Allegra-D 12 Hour 60 mg-120 mg tablet,extended release  TAKE 1 TABLET BY MOUTH TWICE A DAY   Butalbital-APAP-Caffeine 50-325-40 MG capsule butalbital-acetaminophen-caffeine 50 mg-325 mg-40 mg  capsule  Take 1 capsule every 4 hours by oral route.   ibuprofen 600 MG tablet Commonly known as: ADVIL Take 1 tablet (600 mg total) by mouth every 6 (six) hours.   prenatal multivitamin Tabs tablet Take 1 tablet by mouth daily at 12 noon.   Tums 500 MG chewable tablet Generic drug: calcium carbonate Tums 200 mg calcium (500 mg) chewable tablet  Take by oral route.         Discharge home in stable condition Infant Feeding: Breast and formula Infant Disposition:home with mother Discharge instruction: per After Visit Summary and Postpartum booklet. Activity: Advance as tolerated. Pelvic rest for 6 weeks.  Diet: routine diet Anticipated Birth Control: Unsure Postpartum Appointment:6 weeks Additional Postpartum F/U: as needed  Future Appointments:No future appointments. Follow up Visit:  Follow-up Information     Olivia Mackie, MD Follow up in 6 week(s).   Specialty: Obstetrics and Gynecology Contact information: [address] [place] [address] [phone]                     04/23/2023 Robley Fries, MD

## 2023-04-24 LAB — TYPE AND SCREEN
ABO/RH(D): O NEG
Antibody Screen: POSITIVE
Unit division: 0
Unit division: 0

## 2023-04-24 LAB — BPAM RBC
Blood Product Expiration Date: 202409132359
Blood Product Expiration Date: 202409242359
Unit Type and Rh: 9500
Unit Type and Rh: 9500

## 2023-04-24 LAB — BIRTH TISSUE RECOVERY COLLECTION (PLACENTA DONATION)

## 2023-05-24 ENCOUNTER — Telehealth (HOSPITAL_COMMUNITY): Payer: Self-pay | Admitting: *Deleted

## 2023-05-24 NOTE — Telephone Encounter (Signed)
05/24/2023  Name: BERKLEY WRIGHTSMAN MRN: [id number] DOB: 1985-10-24  Reason for Call:  Transition of Care Hospital Discharge Call  Contact Status: Patient Contact Status: Message  Language assistant needed:          Follow-Up Questions:    Inocente Salles Postnatal Depression Scale:  In the Past 7 Days:    PHQ2-9 Depression Scale:     Discharge Follow-up:    Post-discharge interventions: NA  Salena Saner, RN 05/24/2023 13:35
